# Patient Record
Sex: Female | Born: 1985 | Race: Black or African American | Hispanic: No | Marital: Single | Smoking: Never smoker
Health system: Southern US, Community
[De-identification: ages and names within clinical notes are randomized; demographics above are authoritative.]

## PROBLEM LIST (undated history)

## (undated) DIAGNOSIS — J45909 Unspecified asthma, uncomplicated: Secondary | ICD-10-CM

## (undated) DIAGNOSIS — O210 Mild hyperemesis gravidarum: Secondary | ICD-10-CM

## (undated) DIAGNOSIS — N390 Urinary tract infection, site not specified: Secondary | ICD-10-CM

## (undated) DIAGNOSIS — I959 Hypotension, unspecified: Secondary | ICD-10-CM

## (undated) DIAGNOSIS — M797 Fibromyalgia: Secondary | ICD-10-CM

## (undated) HISTORY — PX: ABDOMINAL SURGERY: SHX537

## (undated) HISTORY — PX: KNEE ARTHROSCOPY: SUR90

---

## 2014-03-24 ENCOUNTER — Encounter (HOSPITAL_BASED_OUTPATIENT_CLINIC_OR_DEPARTMENT_OTHER): Payer: Self-pay | Admitting: Emergency Medicine

## 2014-03-24 ENCOUNTER — Emergency Department (HOSPITAL_BASED_OUTPATIENT_CLINIC_OR_DEPARTMENT_OTHER)
Admission: EM | Admit: 2014-03-24 | Discharge: 2014-03-24 | Disposition: A | Discharge status: Home / Self Care | Attending: Emergency Medicine | Admitting: Emergency Medicine

## 2014-03-24 DIAGNOSIS — Z91040 Latex allergy status: Secondary | ICD-10-CM | POA: Insufficient documentation

## 2014-03-24 DIAGNOSIS — M791 Myalgia, unspecified site: Secondary | ICD-10-CM

## 2014-03-24 DIAGNOSIS — IMO0002 Reserved for concepts with insufficient information to code with codable children: Secondary | ICD-10-CM | POA: Insufficient documentation

## 2014-03-24 DIAGNOSIS — Z8679 Personal history of other diseases of the circulatory system: Secondary | ICD-10-CM | POA: Insufficient documentation

## 2014-03-24 DIAGNOSIS — J45909 Unspecified asthma, uncomplicated: Secondary | ICD-10-CM | POA: Insufficient documentation

## 2014-03-24 DIAGNOSIS — IMO0001 Reserved for inherently not codable concepts without codable children: Secondary | ICD-10-CM | POA: Insufficient documentation

## 2014-03-24 DIAGNOSIS — M25569 Pain in unspecified knee: Secondary | ICD-10-CM | POA: Insufficient documentation

## 2014-03-24 HISTORY — DX: Hypotension, unspecified: I95.9

## 2014-03-24 HISTORY — DX: Unspecified asthma, uncomplicated: J45.909

## 2014-03-24 HISTORY — DX: Fibromyalgia: M79.7

## 2014-03-24 LAB — COMPREHENSIVE METABOLIC PANEL
ALBUMIN: 4.5 g/dL (ref 3.5–5.2)
ALK PHOS: 52 U/L (ref 39–117)
ALT: 14 U/L (ref 0–35)
AST: 18 U/L (ref 0–37)
BILIRUBIN TOTAL: 0.3 mg/dL (ref 0.3–1.2)
BUN: 9 mg/dL (ref 6–23)
CHLORIDE: 103 meq/L (ref 96–112)
CO2: 23 mEq/L (ref 19–32)
Calcium: 10 mg/dL (ref 8.4–10.5)
Creatinine, Ser: 0.8 mg/dL (ref 0.50–1.10)
GFR calc Af Amer: >90 mL/min (ref >90)
GFR calc non Af Amer: >90 mL/min (ref >90)
Glucose, Bld: 82 mg/dL (ref 70–99)
POTASSIUM: 4.4 meq/L (ref 3.7–5.3)
SODIUM: 140 meq/L (ref 137–147)
TOTAL PROTEIN: 8.4 g/dL — AB (ref 6.0–8.3)

## 2014-03-24 LAB — CBC WITH DIFFERENTIAL/PLATELET
BASOS ABS: 0 10*3/uL (ref 0.0–0.1)
Basophils Relative: 0 % (ref 0–1)
Eosinophils Absolute: 0.1 10*3/uL (ref 0.0–0.7)
Eosinophils Relative: 2 % (ref 0–5)
HCT: 42.7 % (ref 36.0–46.0)
HEMOGLOBIN: 14.1 g/dL (ref 12.0–15.0)
Lymphocytes Relative: 48 % — ABNORMAL HIGH (ref 12–46)
Lymphs Abs: 1.6 10*3/uL (ref 0.7–4.0)
MCH: 26 pg (ref 26.0–34.0)
MCHC: 33 g/dL (ref 30.0–36.0)
MCV: 78.6 fL (ref 78.0–100.0)
MONOS PCT: 9 % (ref 3–12)
Monocytes Absolute: 0.3 10*3/uL (ref 0.1–1.0)
NEUTROS ABS: 1.4 10*3/uL — AB (ref 1.7–7.7)
Neutrophils Relative %: 41 % — ABNORMAL LOW (ref 43–77)
PLATELETS: 223 10*3/uL (ref 150–400)
RBC: 5.43 MIL/uL — ABNORMAL HIGH (ref 3.87–5.11)
RDW: 13.5 % (ref 11.5–15.5)
WBC: 3.4 10*3/uL — ABNORMAL LOW (ref 4.0–10.5)

## 2014-03-24 LAB — SEDIMENTATION RATE: Sed Rate: 2 mm/hr (ref 0–22)

## 2014-03-24 MED ORDER — HYDROCODONE-ACETAMINOPHEN 5-325 MG PO TABS: 2.0000 | ORAL_TABLET | ORAL | Status: DC | PRN

## 2014-03-24 MED ORDER — IBUPROFEN 800 MG PO TABS: 800.0000 mg | ORAL_TABLET | Freq: Three times a day (TID) | ORAL | Status: DC

## 2014-03-24 NOTE — ED Notes (Signed)
Patient states she woke up with left knee pain and now has progressed to bilateral leg pain and numbness of legs and feet.  No known injury.

## 2014-03-24 NOTE — Discharge Instructions (Signed)
°  Muscle Cramps and Spasms °Muscle cramps and spasms occur when a muscle or muscles tighten and you have no control over this tightening (involuntary muscle contraction). They are a common problem and can develop in any muscle. The most common place is in the calf muscles of the leg. Both muscle cramps and muscle spasms are involuntary muscle contractions, but they also have differences:  °· Muscle cramps are sporadic and painful. They may last a few seconds to a quarter of an hour. Muscle cramps are often more forceful and last longer than muscle spasms. °· Muscle spasms may or may not be painful. They may also last just a few seconds or much longer. °CAUSES  °It is uncommon for cramps or spasms to be due to a serious underlying problem. In many cases, the cause of cramps or spasms is unknown. Some common causes are:  °· Overexertion.   °· Overuse from repetitive motions (doing the same thing over and over).   °· Remaining in a certain position for a long period of time.   °· Improper preparation, form, or technique while performing a sport or activity.   °· Dehydration.   °· Injury.   °· Side effects of some medicines.   °· Abnormally low levels of the salts and ions in your blood (electrolytes), especially potassium and calcium. This could happen if you are taking water pills (diuretics) or you are pregnant.   °Some underlying medical problems can make it more likely to develop cramps or spasms. These include, but are not limited to:  °· Diabetes.   °· Parkinson disease.   °· Hormone disorders, such as thyroid problems.   °· Alcohol abuse.   °· Diseases specific to muscles, joints, and bones.   °· Blood vessel disease where not enough blood is getting to the muscles.   °HOME CARE INSTRUCTIONS  °· Stay well hydrated. Drink enough water and fluids to keep your urine clear or pale yellow. °· It may be helpful to massage, stretch, and relax the affected muscle. °· For tight or tense muscles, use a warm towel, heating  pad, or hot shower water directed to the affected area. °· If you are sore or have pain after a cramp or spasm, applying ice to the affected area may relieve discomfort. °· Put ice in a plastic bag. °· Place a towel between your skin and the bag. °· Leave the ice on for 15-20 minutes, 03-04 times a day. °· Medicines used to treat a known cause of cramps or spasms may help reduce their frequency or severity. Only take over-the-counter or prescription medicines as directed by your caregiver. °SEEK MEDICAL CARE IF:  °Your cramps or spasms get more severe, more frequent, or do not improve over time.  °MAKE SURE YOU:  °· Understand these instructions. °· Will watch your condition. °· Will get help right away if you are not doing well or get worse. °Document Released: 04/12/2002 Document Revised: 02/15/2013 Document Reviewed: 10/07/2012 °ExitCare® Patient Information ©2014 [employer]. ° ° °

## 2014-03-24 NOTE — ED Provider Notes (Signed)
CSN: [id number]     Arrival date & time 03/24/14  1228 History   First MD Initiated Contact with Patient 03/24/14 1309     Chief Complaint  Patient presents with  . Leg Pain     (Consider location/radiation/quality/duration/timing/severity/associated sxs/prior Treatment) Patient is a 28 y.o. female presenting with leg pain. The history is provided by the patient. No language interpreter was used.  Leg Pain Location:  Leg Injury: no   Leg location:  L leg and R leg Pain details:    Quality:  Aching   Severity:  Moderate   Onset quality:  Gradual   Duration:  1 month   Timing:  Constant Chronicity:  New Dislocation: no   Relieved by:  Nothing Worsened by:  Nothing tried Ineffective treatments:  None tried Associated symptoms: no back pain   Pt recently had an evaluation to rule out MS. Normal MRI.   Pt reports legs are achy and feel crampy.  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 Fibromyalgia   . Asthma   . Hypotension    Past Surgical History  Procedure Laterality Date  . Knee arthroscopy Left    No family history on file. History  Substance Use Topics  . Smoking status: Never Smoker   . Smokeless tobacco: Never Used  . Alcohol Use: No   OB History   Grav Para Term Preterm Abortions TAB SAB Ect Mult Living                 Review of Systems  Musculoskeletal: Negative for back pain.  Skin: Negative for wound.  All other systems reviewed and are negative.     Allergies  Latex  Home Medications   Prior to Admission medications   Medication Sig Start Date End Date Taking? Authorizing Provider  fluticasone (FLOVENT HFA) 110 MCG/ACT inhaler Inhale into the lungs 2 (two) times daily.   Yes Historical Provider, MD   BP 117/69  Pulse 74  Temp(Src) 98.4 F (36.9 C) (Oral)  Resp 20  Ht 5\' 4"  (1.626 m)  Wt 155 lb (70.308 kg)  BMI 26.59 kg/m2  SpO2 100%  LMP 03/21/2014 Physical Exam  Nursing note and  vitals reviewed. Constitutional: She is oriented to person, place, and time. She appears well-developed and well-nourished.  HENT:  Head: Normocephalic and atraumatic.  Eyes: EOM are normal. Pupils are equal, round, and reactive to light.  Neck: Normal range of motion.  Cardiovascular: Normal rate.   Pulmonary/Chest: Effort normal.  Abdominal: Soft. She exhibits no distension.  Musculoskeletal: Normal range of motion.  Tender left and right knee.  Tender legs diffusely  Neurological: She is alert and oriented to person, place, and time.  Skin: Skin is warm.  Psychiatric: She has a normal mood and affect.    ED Course  Procedures (including critical care time) Labs Review Labs Reviewed  CBC WITH DIFFERENTIAL - Abnormal; Notable for the following:    WBC 3.4 (*)    RBC 5.43 (*)    Neutrophils Relative % 41 (*)    Neutro Abs 1.4 (*)    Lymphocytes Relative 48 (*)    All other components within normal limits  COMPREHENSIVE METABOLIC PANEL - Abnormal; Notable for the following:    Total Protein 8.4 (*)    All other components within normal limits  SEDIMENTATION RATE    Imaging Review No results found.   EKG Interpretation None  MDM   Final diagnoses:  None    Ibuprofen Hydrocodone Follow up with your MD for recheck    Elson AreasLeslie K Shary Lamos, PA-C 03/24/14 1618

## 2014-03-25 NOTE — ED Provider Notes (Signed)
Medical screening examination/treatment/procedure(s) were performed by non-physician practitioner and as supervising physician I was immediately available for consultation/collaboration.   EKG Interpretation None        Gwyneth Sprout, MD 03/25/14 [phone]

## 2014-08-12 ENCOUNTER — Emergency Department (HOSPITAL_BASED_OUTPATIENT_CLINIC_OR_DEPARTMENT_OTHER)
Admission: EM | Admit: 2014-08-12 | Discharge: 2014-08-12 | Disposition: A | Discharge status: Home / Self Care | Attending: Emergency Medicine | Admitting: Emergency Medicine

## 2014-08-12 ENCOUNTER — Encounter (HOSPITAL_BASED_OUTPATIENT_CLINIC_OR_DEPARTMENT_OTHER): Payer: Self-pay | Admitting: Emergency Medicine

## 2014-08-12 DIAGNOSIS — O9A211 Injury, poisoning and certain other consequences of external causes complicating pregnancy, first trimester: Secondary | ICD-10-CM | POA: Insufficient documentation

## 2014-08-12 DIAGNOSIS — S233XXA Sprain of ligaments of thoracic spine, initial encounter: Secondary | ICD-10-CM

## 2014-08-12 DIAGNOSIS — Z7952 Long term (current) use of systemic steroids: Secondary | ICD-10-CM | POA: Insufficient documentation

## 2014-08-12 DIAGNOSIS — S29012A Strain of muscle and tendon of back wall of thorax, initial encounter: Secondary | ICD-10-CM | POA: Insufficient documentation

## 2014-08-12 DIAGNOSIS — Z3A09 9 weeks gestation of pregnancy: Secondary | ICD-10-CM | POA: Insufficient documentation

## 2014-08-12 DIAGNOSIS — O99511 Diseases of the respiratory system complicating pregnancy, first trimester: Secondary | ICD-10-CM | POA: Insufficient documentation

## 2014-08-12 DIAGNOSIS — Y92410 Unspecified street and highway as the place of occurrence of the external cause: Secondary | ICD-10-CM | POA: Insufficient documentation

## 2014-08-12 DIAGNOSIS — Z791 Long term (current) use of non-steroidal anti-inflammatories (NSAID): Secondary | ICD-10-CM | POA: Insufficient documentation

## 2014-08-12 DIAGNOSIS — Y9389 Activity, other specified: Secondary | ICD-10-CM | POA: Insufficient documentation

## 2014-08-12 DIAGNOSIS — J45909 Unspecified asthma, uncomplicated: Secondary | ICD-10-CM | POA: Insufficient documentation

## 2014-08-12 DIAGNOSIS — Z8739 Personal history of other diseases of the musculoskeletal system and connective tissue: Secondary | ICD-10-CM | POA: Insufficient documentation

## 2014-08-12 DIAGNOSIS — Z91040 Latex allergy status: Secondary | ICD-10-CM | POA: Insufficient documentation
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
## 2014-08-12 NOTE — ED Provider Notes (Signed)
History/physical exam/procedure(s) were performed by non-physician practitioner and as supervising physician I was immediately available for consultation/collaboration. I have reviewed all notes and am in agreement with care and plan.   Hilario Quarryanielle S Davied Nocito, MD 08/12/14 [phone]
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
## 2014-08-18 ENCOUNTER — Encounter (HOSPITAL_BASED_OUTPATIENT_CLINIC_OR_DEPARTMENT_OTHER): Payer: Self-pay | Admitting: Emergency Medicine

## 2014-08-18 ENCOUNTER — Emergency Department (HOSPITAL_BASED_OUTPATIENT_CLINIC_OR_DEPARTMENT_OTHER)
Admission: EM | Admit: 2014-08-18 | Discharge: 2014-08-18 | Disposition: A | Discharge status: Home / Self Care | Attending: Emergency Medicine | Admitting: Emergency Medicine

## 2014-08-18 DIAGNOSIS — B349 Viral infection, unspecified: Secondary | ICD-10-CM | POA: Diagnosis not present

## 2014-08-18 DIAGNOSIS — Z791 Long term (current) use of non-steroidal anti-inflammatories (NSAID): Secondary | ICD-10-CM | POA: Insufficient documentation

## 2014-08-18 DIAGNOSIS — Z7951 Long term (current) use of inhaled steroids: Secondary | ICD-10-CM | POA: Diagnosis not present

## 2014-08-18 DIAGNOSIS — R059 Cough, unspecified: Secondary | ICD-10-CM

## 2014-08-18 DIAGNOSIS — R05 Cough: Secondary | ICD-10-CM | POA: Diagnosis present

## 2014-08-18 DIAGNOSIS — J45909 Unspecified asthma, uncomplicated: Secondary | ICD-10-CM | POA: Diagnosis not present

## 2014-08-18 NOTE — ED Provider Notes (Signed)
CSN: [id number]     Arrival date & time 08/18/14  1347 History   First MD Initiated Contact with Patient 08/18/14 1352     Chief Complaint  Patient presents with  . Cough     (Consider location/radiation/quality/duration/timing/severity/associated sxs/prior Treatment) HPI Comments: Pt comes in today with c/o of cough times one week. She states that she is [redacted] weeks pregnant. Pt states that she has needed her inhaler more frequently. She denies fevers or sob. Denies any abdominal pain  The history is provided by the patient. No language interpreter was used.    Past Medical History  Diagnosis Date  . Fibromyalgia   . Asthma   . Hypotension    Past Surgical History  Procedure Laterality Date  . Knee arthroscopy Left    History reviewed. No pertinent family history. History  Substance Use Topics  . Smoking status: Never Smoker   . Smokeless tobacco: Never Used  . Alcohol Use: No   OB History   Grav Para Term Preterm Abortions TAB SAB Ect Mult Living   1              Review of Systems  Constitutional: Negative.   Respiratory: Positive for cough.   Cardiovascular: Negative.       Allergies  Latex  Home Medications   Prior to Admission medications   Medication Sig Start Date End Date Taking? Authorizing Provider  fluticasone (FLOVENT HFA) 110 MCG/ACT inhaler Inhale into the lungs 2 (two) times daily.    Historical Provider, MD  HYDROcodone-acetaminophen (NORCO/VICODIN) 5-325 MG per tablet Take 2 tablets by mouth every 4 (four) hours as needed. 03/24/14   Elson AreasLeslie K Sofia, PA-C  ibuprofen (ADVIL,MOTRIN) 800 MG tablet Take 1 tablet (800 mg total) by mouth 3 (three) times daily. 03/24/14   Elson AreasLeslie K Sofia, PA-C  Prenatal Multivit-Min-Fe-FA (PRENATAL VITAMINS PO) Take by mouth.    Historical Provider, MD   BP 148/87  Pulse 102  Temp(Src) 99.7 F (37.6 C) (Oral)  Resp 20  SpO2 99%  LMP 06/12/2014 Physical Exam  Nursing note and vitals reviewed. Constitutional: She  appears well-developed and well-nourished.  HENT:  Right Ear: External ear normal.  Left Ear: External ear normal.  Nose: Rhinorrhea present.  Mouth/Throat: Posterior oropharyngeal erythema present.  Eyes: Conjunctivae and EOM are normal.  Cardiovascular: Normal rate and regular rhythm.   Pulmonary/Chest: Effort normal and breath sounds normal.    ED Course  Procedures (including critical care time) Labs Review Labs Reviewed - No data to display  Imaging Review No results found.   EKG Interpretation None      MDM   Final diagnoses:  Cough  Viral illness    No focal abnormality noted on respiratory exam. Pt afebrile. Don't think imaging is needed at his time. Discussed symptomatic treatment at home and return precautions    Teressa LowerVrinda Chianti Goh, NP 08/18/14 1408

## 2014-08-18 NOTE — Discharge Instructions (Signed)
Take benadryl as discussed. Follow up with your doctor for continued or worsening symptoms Cough, Adult  A cough is a reflex that helps clear your throat and airways. It can help heal the body or may be a reaction to an irritated airway. A cough may only last 2 or 3 weeks (acute) or may last more than 8 weeks (chronic).  CAUSES Acute cough:  Viral or bacterial infections. Chronic cough:  Infections.  Allergies.  Asthma.  Post-nasal drip.  Smoking.  Heartburn or acid reflux.  Some medicines.  Chronic lung problems (COPD).  Cancer. SYMPTOMS   Cough.  Fever.  Chest pain.  Increased breathing rate.  High-pitched whistling sound when breathing (wheezing).  Colored mucus that you cough up (sputum). TREATMENT   A bacterial cough may be treated with antibiotic medicine.  A viral cough must run its course and will not respond to antibiotics.  Your caregiver may recommend other treatments if you have a chronic cough. HOME CARE INSTRUCTIONS   Only take over-the-counter or prescription medicines for pain, discomfort, or fever as directed by your caregiver. Use cough suppressants only as directed by your caregiver.  Use a cold steam vaporizer or humidifier in your bedroom or home to help loosen secretions.  Sleep in a semi-upright position if your cough is worse at night.  Rest as needed.  Stop smoking if you smoke. SEEK IMMEDIATE MEDICAL CARE IF:   You have pus in your sputum.  Your cough starts to worsen.  You cannot control your cough with suppressants and are losing sleep.  You begin coughing up blood.  You have difficulty breathing.  You develop pain which is getting worse or is uncontrolled with medicine.  You have a fever. MAKE SURE YOU:   Understand these instructions.  Will watch your condition.  Will get help right away if you are not doing well or get worse. Document Released: 04/19/2011 Document Revised: 01/13/2012 Document Reviewed:  04/19/2011 [facility] Patient Information 2015 [place], [place]. This information is not intended to replace advice given to you by your health care provider. Make sure you discuss any questions you have with your health care provider.

## 2014-08-18 NOTE — ED Notes (Signed)
Pt amb to room 5 with quick steady gait in nad. Pt reports cough x 1 wwek, initially clear sputum, now greenish per pt.

## 2014-08-18 NOTE — ED Provider Notes (Signed)
Medical screening examination/treatment/procedure(s) were performed by non-physician practitioner and as supervising physician I was immediately available for consultation/collaboration.   EKG Interpretation None        Elwin MochaBlair Chastin Garlitz, MD 08/18/14 1513

## 2014-09-05 ENCOUNTER — Encounter (HOSPITAL_BASED_OUTPATIENT_CLINIC_OR_DEPARTMENT_OTHER): Payer: Self-pay | Admitting: Emergency Medicine

## 2016-03-27 ENCOUNTER — Encounter (HOSPITAL_BASED_OUTPATIENT_CLINIC_OR_DEPARTMENT_OTHER): Payer: Self-pay | Admitting: Emergency Medicine

## 2016-03-27 ENCOUNTER — Emergency Department (HOSPITAL_BASED_OUTPATIENT_CLINIC_OR_DEPARTMENT_OTHER)
Admission: EM | Admit: 2016-03-27 | Discharge: 2016-03-27 | Disposition: A | Discharge status: Home / Self Care | Payer: Medicaid Other | Attending: Emergency Medicine | Admitting: Emergency Medicine

## 2016-03-27 DIAGNOSIS — J45909 Unspecified asthma, uncomplicated: Secondary | ICD-10-CM | POA: Insufficient documentation

## 2016-03-27 DIAGNOSIS — R102 Pelvic and perineal pain: Secondary | ICD-10-CM | POA: Diagnosis present

## 2016-03-27 DIAGNOSIS — M5431 Sciatica, right side: Secondary | ICD-10-CM | POA: Insufficient documentation

## 2016-03-27 MED ORDER — PREDNISONE 10 MG PO TABS: 20.0000 mg | ORAL_TABLET | Freq: Two times a day (BID) | ORAL | Status: DC

## 2016-03-27 MED ORDER — TRAMADOL HCL 50 MG PO TABS: 50.0000 mg | ORAL_TABLET | Freq: Four times a day (QID) | ORAL | Status: DC | PRN
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
## 2016-03-27 NOTE — Discharge Instructions (Signed)
Prednisone as prescribed.  Tramadol as prescribed as needed for pain.  Keep the appointment with your orthopedist as scheduled, and return to the ER if symptoms significantly worsen or change.   Sciatica Sciatica is pain, weakness, numbness, or tingling along the path of the sciatic nerve. The nerve starts in the lower back and runs down the back of each leg. The nerve controls the muscles in the lower leg and in the back of the knee, while also providing sensation to the back of the thigh, lower leg, and the sole of your foot. Sciatica is a symptom of another medical condition. For instance, nerve damage or certain conditions, such as a herniated disk or bone spur on the spine, pinch or put pressure on the sciatic nerve. This causes the pain, weakness, or other sensations normally associated with sciatica. Generally, sciatica only affects one side of the body. CAUSES   Herniated or slipped disc.  Degenerative disk disease.  A pain disorder involving the narrow muscle in the buttocks (piriformis syndrome).  Pelvic injury or fracture.  Pregnancy.  Tumor (rare). SYMPTOMS  Symptoms can vary from mild to very severe. The symptoms usually travel from the low back to the buttocks and down the back of the leg. Symptoms can include:  Mild tingling or dull aches in the lower back, leg, or hip.  Numbness in the back of the calf or sole of the foot.  Burning sensations in the lower back, leg, or hip.  Sharp pains in the lower back, leg, or hip.  Leg weakness.  Severe back pain inhibiting movement. These symptoms may get worse with coughing, sneezing, laughing, or prolonged sitting or standing. Also, being overweight may worsen symptoms. DIAGNOSIS  Your caregiver will perform a physical exam to look for common symptoms of sciatica. He or she may ask you to do certain movements or activities that would trigger sciatic nerve pain. Other tests may be performed to find the cause of the  sciatica. These may include:  Blood tests.  X-rays.  Imaging tests, such as an MRI or CT scan. TREATMENT  Treatment is directed at the cause of the sciatic pain. Sometimes, treatment is not necessary and the pain and discomfort goes away on its own. If treatment is needed, your caregiver may suggest:  Over-the-counter medicines to relieve pain.  Prescription medicines, such as anti-inflammatory medicine, muscle relaxants, or narcotics.  Applying heat or ice to the painful area.  Steroid injections to lessen pain, irritation, and inflammation around the nerve.  Reducing activity during periods of pain.  Exercising and stretching to strengthen your abdomen and improve flexibility of your spine. Your caregiver may suggest losing weight if the extra weight makes the back pain worse.  Physical therapy.  Surgery to eliminate what is pressing or pinching the nerve, such as a bone spur or part of a herniated disk. HOME CARE INSTRUCTIONS   Only take over-the-counter or prescription medicines for pain or discomfort as directed by your caregiver.  Apply ice to the affected area for 20 minutes, 3-4 times a day for the first 48-72 hours. Then try heat in the same way.  Exercise, stretch, or perform your usual activities if these do not aggravate your pain.  Attend physical therapy sessions as directed by your caregiver.  Keep all follow-up appointments as directed by your caregiver.  Do not wear high heels or shoes that do not provide proper support.  Check your mattress to see if it is too soft. A firm mattress  may lessen your pain and discomfort. SEEK IMMEDIATE MEDICAL CARE IF:   You lose control of your bowel or bladder (incontinence).  You have increasing weakness in the lower back, pelvis, buttocks, or legs.  You have redness or swelling of your back.  You have a burning sensation when you urinate.  You have pain that gets worse when you lie down or awakens you at  night.  Your pain is worse than you have experienced in the past.  Your pain is lasting longer than 4 weeks.  You are suddenly losing weight without reason. MAKE SURE YOU:  Understand these instructions.  Will watch your condition.  Will get help right away if you are not doing well or get worse.   This information is not intended to replace advice given to you by your health care provider. Make sure you discuss any questions you have with your health care provider.   Document Released: 10/15/2001 Document Revised: 07/12/2015 Document Reviewed: 03/01/2012 Elsevier Interactive Patient Education [employer].

## 2016-03-27 NOTE — ED Provider Notes (Signed)
CSN: [id number]     Arrival date & time 03/27/16  0901 History   First MD Initiated Contact with Patient 03/27/16 0914     Chief Complaint  Patient presents with  . Hip Pain     (Consider location/radiation/quality/duration/timing/severity/associated sxs/prior Treatment) HPI Comments: Patient is a 30 year old female who presents with complaints of pain in her right buttock radiating down the back of her leg. This started yesterday after a fall. She reports a history of her hip "giving out on her" since she was in the [employer] several years ago. She reports that she injured running and it has given her intermittent problems since then. She was recently seen by her primary doctor and given a cortisone injection, started on meloxicam, and is currently awaiting an appointment with an orthopedist. She denies any bowel or bladder complaints. She denies to me she is experiencing any pain in her back.  Patient is a 30 y.o. female presenting with hip pain. The history is provided by the patient.  Hip Pain This is a new problem. The current episode started yesterday. The problem occurs constantly. The problem has been gradually worsening. The symptoms are aggravated by walking (Bearing weight). Nothing relieves the symptoms. Treatments tried: NSAIDs. The treatment provided no relief.    Past Medical History  Diagnosis Date  . Fibromyalgia   . Asthma   . Hypotension    Past Surgical History  Procedure Laterality Date  . Knee arthroscopy Left    History reviewed. No pertinent family history. Social History  Substance Use Topics  . Smoking status: Never Smoker   . Smokeless tobacco: Never Used  . Alcohol Use: No   OB History    Gravida Para Term Preterm AB TAB SAB Ectopic Multiple Living   1              Review of Systems  All other systems reviewed and are negative.     Allergies  Latex  Home Medications   Prior to Admission medications   Medication Sig Start Date End Date  Taking? Authorizing Provider  fluticasone (FLOVENT HFA) 110 MCG/ACT inhaler Inhale into the lungs 2 (two) times daily.    Historical Provider, MD  HYDROcodone-acetaminophen (NORCO/VICODIN) 5-325 MG per tablet Take 2 tablets by mouth every 4 (four) hours as needed. 03/24/14   Elson AreasLeslie K Sofia, PA-C  ibuprofen (ADVIL,MOTRIN) 800 MG tablet Take 1 tablet (800 mg total) by mouth 3 (three) times daily. 03/24/14   Elson AreasLeslie K Sofia, PA-C  Prenatal Multivit-Min-Fe-FA (PRENATAL VITAMINS PO) Take by mouth.    Historical Provider, MD   BP 117/84 mmHg  Pulse 70  Temp(Src) 97.8 F (36.6 C) (Oral)  Resp 18  Ht 5\' 4"  (1.626 m)  Wt 155 lb (70.308 kg)  BMI 26.59 kg/m2  SpO2 100%  Breastfeeding? Unknown Physical Exam  Constitutional: She is oriented to person, place, and time. She appears well-developed and well-nourished. No distress.  HENT:  Head: Normocephalic and atraumatic.  Neck: Normal range of motion. Neck supple.  Musculoskeletal:  There is tenderness to palpation in the soft tissues of the right buttock. There is no lumbar spine tenderness.  Neurological: She is alert and oriented to person, place, and time.  DTRs are trace and symmetrical in the patellar and Achilles tendon bilaterally. Strength is 5 out of 5 in both lower extremities. She is able to ambulate with some difficulty.  Skin: Skin is warm and dry. She is not diaphoretic.  Nursing note and vitals reviewed.  ED Course  Procedures (including critical care time) Labs Review Labs Reviewed - No data to display  Imaging Review No results found. I have personally reviewed and evaluated these images and lab results as part of my medical decision-making.   EKG Interpretation None      MDM   Final diagnoses:  None    This appears to be sciatica. She will be treated with prednisone and tramadol. She is to keep her appointment with an orthopedist as scheduled.  I see no indication for imaging. I highly doubt a hip fracture she is  able to ambulate and bear weight without difficulty.    Geoffery Lyons, MD 03/27/16 [id number]

## 2016-04-05 ENCOUNTER — Encounter (HOSPITAL_BASED_OUTPATIENT_CLINIC_OR_DEPARTMENT_OTHER): Payer: Self-pay | Admitting: *Deleted

## 2016-04-05 ENCOUNTER — Emergency Department (HOSPITAL_BASED_OUTPATIENT_CLINIC_OR_DEPARTMENT_OTHER): Payer: Medicaid Other

## 2016-04-05 ENCOUNTER — Emergency Department (HOSPITAL_BASED_OUTPATIENT_CLINIC_OR_DEPARTMENT_OTHER)
Admission: EM | Admit: 2016-04-05 | Discharge: 2016-04-05 | Disposition: A | Discharge status: Home / Self Care | Payer: Medicaid Other | Attending: Emergency Medicine | Admitting: Emergency Medicine

## 2016-04-05 DIAGNOSIS — Y939 Activity, unspecified: Secondary | ICD-10-CM | POA: Diagnosis not present

## 2016-04-05 DIAGNOSIS — J45909 Unspecified asthma, uncomplicated: Secondary | ICD-10-CM | POA: Diagnosis not present

## 2016-04-05 DIAGNOSIS — R51 Headache: Secondary | ICD-10-CM | POA: Diagnosis present

## 2016-04-05 DIAGNOSIS — Y999 Unspecified external cause status: Secondary | ICD-10-CM | POA: Diagnosis not present

## 2016-04-05 DIAGNOSIS — Y92410 Unspecified street and highway as the place of occurrence of the external cause: Secondary | ICD-10-CM | POA: Diagnosis not present

## 2016-04-05 DIAGNOSIS — S060X0A Concussion without loss of consciousness, initial encounter: Secondary | ICD-10-CM | POA: Diagnosis not present

## 2016-04-05 DIAGNOSIS — M542 Cervicalgia: Secondary | ICD-10-CM | POA: Diagnosis not present

## 2016-04-05 MED ORDER — DIAZEPAM 2 MG PO TABS
2.0000 mg | ORAL_TABLET | Freq: Once | ORAL | Status: AC
  Administered 2016-04-05: 2 mg via ORAL
  Filled 2016-04-05: qty 1

## 2016-04-05 MED ORDER — ONDANSETRON 4 MG PO TBDP
4.0000 mg | ORAL_TABLET | Freq: Once | ORAL | Status: AC
  Administered 2016-04-05: 4 mg via ORAL
  Filled 2016-04-05: qty 1

## 2016-04-05 MED ORDER — NAPROXEN 500 MG PO TABS: 500.0000 mg | ORAL_TABLET | Freq: Two times a day (BID) | ORAL | Status: DC

## 2016-04-05 MED ORDER — METHOCARBAMOL 500 MG PO TABS: 500.0000 mg | ORAL_TABLET | Freq: Two times a day (BID) | ORAL | Status: DC

## 2016-04-05 MED ORDER — KETOROLAC TROMETHAMINE 60 MG/2ML IM SOLN
60.0000 mg | Freq: Once | INTRAMUSCULAR | Status: AC
  Administered 2016-04-05: 60 mg via INTRAMUSCULAR
  Filled 2016-04-05: qty 2

## 2016-04-05 NOTE — Discharge Instructions (Signed)
Concussion, Adult A concussion, or closed-head injury, is a brain injury caused by a direct blow to the head or by a quick and sudden movement (jolt) of the head or neck. Concussions are usually not life-threatening. Even so, the effects of a concussion can be serious. If you have had a concussion before, you are more likely to experience concussion-like symptoms after a direct blow to the head.  CAUSES  Direct blow to the head, such as from running into another player during a soccer game, being hit in a fight, or hitting your head on a hard surface.  A jolt of the head or neck that causes the brain to move back and forth inside the skull, such as in a car crash. SIGNS AND SYMPTOMS The signs of a concussion can be hard to notice. Early on, they may be missed by you, family members, and health care providers. You may look fine but act or feel differently. Symptoms are usually temporary, but they may last for days, weeks, or even longer. Some symptoms may appear right away while others may not show up for hours or days. Every head injury is different. Symptoms include:  Mild to moderate headaches that will not go away.  A feeling of pressure inside your head.  Having more trouble than usual:  Learning or remembering things you have heard.  Answering questions.  Paying attention or concentrating.  Organizing daily tasks.  Making decisions and solving problems.  Slowness in thinking, acting or reacting, speaking, or reading.  Getting lost or being easily confused.  Feeling tired all the time or lacking energy (fatigued).  Feeling drowsy.  Sleep disturbances.  Sleeping more than usual.  Sleeping less than usual.  Trouble falling asleep.  Trouble sleeping (insomnia).  Loss of balance or feeling lightheaded or dizzy.  Nausea or vomiting.  Numbness or tingling.  Increased sensitivity to:  Sounds.  Lights.  Distractions.  Vision problems or eyes that tire  easily.  Diminished sense of taste or smell.  Ringing in the ears.  Mood changes such as feeling sad or anxious.  Becoming easily irritated or angry for little or no reason.  Lack of motivation.  Seeing or hearing things other people do not see or hear (hallucinations). DIAGNOSIS Your health care provider can usually diagnose a concussion based on a description of your injury and symptoms. He or she will ask whether you passed out (lost consciousness) and whether you are having trouble remembering events that happened right before and during your injury. Your evaluation might include:  A brain scan to look for signs of injury to the brain. Even if the test shows no injury, you may still have a concussion.  Blood tests to be sure other problems are not present. TREATMENT  Concussions are usually treated in an emergency department, in urgent care, or at a clinic. You may need to stay in the hospital overnight for further treatment.  Tell your health care provider if you are taking any medicines, including prescription medicines, over-the-counter medicines, and natural remedies. Some medicines, such as blood thinners (anticoagulants) and aspirin, may increase the chance of complications. Also tell your health care provider whether you have had alcohol or are taking illegal drugs. This information may affect treatment.  Your health care provider will send you home with important instructions to follow.  How fast you will recover from a concussion depends on many factors. These factors include how severe your concussion is, what part of your brain was injured,  your age, and how healthy you were before the concussion.  Most people with mild injuries recover fully. Recovery can take time. In general, recovery is slower in older persons. Also, persons who have had a concussion in the past or have other medical problems may find that it takes longer to recover from their current injury. HOME  CARE INSTRUCTIONS General Instructions  Carefully follow the directions your health care provider gave you.  Only take over-the-counter or prescription medicines for pain, discomfort, or fever as directed by your health care provider.  Take only those medicines that your health care provider has approved.  Do not drink alcohol until your health care provider says you are well enough to do so. Alcohol and certain other drugs may slow your recovery and can put you at risk of further injury.  If it is harder than usual to remember things, write them down.  If you are easily distracted, try to do one thing at a time. For example, do not try to watch TV while fixing dinner.  Talk with family members or close friends when making important decisions.  Keep all follow-up appointments. Repeated evaluation of your symptoms is recommended for your recovery.  Watch your symptoms and tell others to do the same. Complications sometimes occur after a concussion. Older adults with a brain injury may have a higher risk of serious complications, such as a blood clot on the brain.  Tell your teachers, school nurse, school counselor, coach, athletic trainer, or work Freight forwarder about your injury, symptoms, and restrictions. Tell them about what you can or cannot do. They should watch for:  Increased problems with attention or concentration.  Increased difficulty remembering or learning new information.  Increased time needed to complete tasks or assignments.  Increased irritability or decreased ability to cope with stress.  Increased symptoms.  Rest. Rest helps the brain to heal. Make sure you:  Get plenty of sleep at night. Avoid staying up late at night.  Keep the same bedtime hours on weekends and weekdays.  Rest during the day. Take daytime naps or rest breaks when you feel tired.  Limit activities that require a lot of thought or concentration. These include:  Doing homework or job-related  work.  Watching TV.  Working on the computer.  Avoid any situation where there is potential for another head injury (football, hockey, soccer, basketball, martial arts, downhill snow sports and horseback riding). Your condition will get worse every time you experience a concussion. You should avoid these activities until you are evaluated by the appropriate follow-up health care providers. Returning To Your Regular Activities You will need to return to your normal activities slowly, not all at once. You must give your body and brain enough time for recovery.  Do not return to sports or other athletic activities until your health care provider tells you it is safe to do so.  Ask your health care provider when you can drive, ride a bicycle, or operate heavy machinery. Your ability to react may be slower after a brain injury. Never do these activities if you are dizzy.  Ask your health care provider about when you can return to work or school. Preventing Another Concussion It is very important to avoid another brain injury, especially before you have recovered. In rare cases, another injury can lead to permanent brain damage, brain swelling, or death. The risk of this is greatest during the first 7-10 days after a head injury. Avoid injuries by:  Wearing a  seat belt when riding in a car.  Drinking alcohol only in moderation.  Wearing a helmet when biking, skiing, skateboarding, skating, or doing similar activities.  Avoiding activities that could lead to a second concussion, such as contact or recreational sports, until your health care provider says it is okay.  Taking safety measures in your home.  Remove clutter and tripping hazards from floors and stairways.  Use grab bars in bathrooms and handrails by stairs.  Place non-slip mats on floors and in bathtubs.  Improve lighting in dim areas. SEEK MEDICAL CARE IF:  You have increased problems paying attention or  concentrating.  You have increased difficulty remembering or learning new information.  You need more time to complete tasks or assignments than before.  You have increased irritability or decreased ability to cope with stress.  You have more symptoms than before. Seek medical care if you have any of the following symptoms for more than 2 weeks after your injury:  Lasting (chronic) headaches.  Dizziness or balance problems.  Nausea.  Vision problems.  Increased sensitivity to noise or light.  Depression or mood swings.  Anxiety or irritability.  Memory problems.  Difficulty concentrating or paying attention.  Sleep problems.  Feeling tired all the time. SEEK IMMEDIATE MEDICAL CARE IF:  You have severe or worsening headaches. These may be a sign of a blood clot in the brain.  You have weakness (even if only in one hand, leg, or part of the face).  You have numbness.  You have decreased coordination.  You vomit repeatedly.  You have increased sleepiness.  One pupil is larger than the other.  You have convulsions.  You have slurred speech.  You have increased confusion. This may be a sign of a blood clot in the brain.  You have increased restlessness, agitation, or irritability.  You are unable to recognize people or places.  You have neck pain.  It is difficult to wake you up.  You have unusual behavior changes.  You lose consciousness. MAKE SURE YOU:  Understand these instructions.  Will watch your condition.  Will get help right away if you are not doing well or get worse.   This information is not intended to replace advice given to you by your health care provider. Make sure you discuss any questions you have with your health care provider.   Document Released: 01/11/2004 Document Revised: 11/11/2014 Document Reviewed: 05/13/2013 [employer] Interactive Patient Education 2016 [employer]  Cervical Strain and Sprain With Rehab Cervical  strain and sprain are injuries that commonly occur with "whiplash" injuries. Whiplash occurs when the neck is forcefully whipped backward or forward, such as during a motor vehicle accident or during contact sports. The muscles, ligaments, tendons, discs, and nerves of the neck are susceptible to injury when this occurs. RISK FACTORS Risk of having a whiplash injury increases if:  Osteoarthritis of the spine.  Situations that make head or neck accidents or trauma more likely.  High-risk sports (football, rugby, wrestling, hockey, auto racing, gymnastics, diving, contact karate, or boxing).  Poor strength and flexibility of the neck.  Previous neck injury.  Poor tackling technique.  Improperly fitted or padded equipment. SYMPTOMS   Pain or stiffness in the front or back of neck or both.  Symptoms may present immediately or up to 24 hours after injury.  Dizziness, headache, nausea, and vomiting.  Muscle spasm with soreness and stiffness in the neck.  Tenderness and swelling at the injury site. PREVENTION  Learn  and use proper technique (avoid tackling with the head, spearing, and head-butting; use proper falling techniques to avoid landing on the head).  Warm up and stretch properly before activity.  Maintain physical fitness:  Strength, flexibility, and endurance.  Cardiovascular fitness.  Wear properly fitted and padded protective equipment, such as padded soft collars, for participation in contact sports. PROGNOSIS  Recovery from cervical strain and sprain injuries is dependent on the extent of the injury. These injuries are usually curable in 1 week to 3 months with appropriate treatment.  RELATED COMPLICATIONS   Temporary numbness and weakness may occur if the nerve roots are damaged, and this may persist until the nerve has completely healed.  Chronic pain due to frequent recurrence of symptoms.  Prolonged healing, especially if activity is resumed too soon  (before complete recovery). TREATMENT  Treatment initially involves the use of ice and medication to help reduce pain and inflammation. It is also important to perform strengthening and stretching exercises and modify activities that worsen symptoms so the injury does not get worse. These exercises may be performed at home or with a therapist. For patients who experience severe symptoms, a soft, padded collar may be recommended to be worn around the neck.  Improving your posture may help reduce symptoms. Posture improvement includes pulling your chin and abdomen in while sitting or standing. If you are sitting, sit in a firm chair with your buttocks against the back of the chair. While sleeping, try replacing your pillow with a small towel rolled to 2 inches in diameter, or use a cervical pillow or soft cervical collar. Poor sleeping positions delay healing.  For patients with nerve root damage, which causes numbness or weakness, the use of a cervical traction apparatus may be recommended. Surgery is rarely necessary for these injuries. However, cervical strain and sprains that are present at birth (congenital) may require surgery. MEDICATION   If pain medication is necessary, nonsteroidal anti-inflammatory medications, such as aspirin and ibuprofen, or other minor pain relievers, such as acetaminophen, are often recommended.  Do not take pain medication for 7 days before surgery.  Prescription pain relievers may be given if deemed necessary by your caregiver. Use only as directed and only as much as you need. HEAT AND COLD:   Cold treatment (icing) relieves pain and reduces inflammation. Cold treatment should be applied for 10 to 15 minutes every 2 to 3 hours for inflammation and pain and immediately after any activity that aggravates your symptoms. Use ice packs or an ice massage.  Heat treatment may be used prior to performing the stretching and strengthening activities prescribed by your  caregiver, physical therapist, or athletic trainer. Use a heat pack or a warm soak. SEEK MEDICAL CARE IF:   Symptoms get worse or do not improve in 2 weeks despite treatment.  New, unexplained symptoms develop (drugs used in treatment may produce side effects). EXERCISES RANGE OF MOTION (ROM) AND STRETCHING EXERCISES - Cervical Strain and Sprain These exercises may help you when beginning to rehabilitate your injury. In order to successfully resolve your symptoms, you must improve your posture. These exercises are designed to help reduce the forward-head and rounded-shoulder posture which contributes to this condition. Your symptoms may resolve with or without further involvement from your physician, physical therapist or athletic trainer. While completing these exercises, remember:   Restoring tissue flexibility helps normal motion to return to the joints. This allows healthier, less painful movement and activity.  An effective stretch should be held for  at least 20 seconds, although you may need to begin with shorter hold times for comfort.  A stretch should never be painful. You should only feel a gentle lengthening or release in the stretched tissue. STRETCH- Axial Extensors  Lie on your back on the floor. You may bend your knees for comfort. Place a rolled-up hand towel or dish towel, about 2 inches in diameter, under the part of your head that makes contact with the floor.  Gently tuck your chin, as if trying to make a "double chin," until you feel a gentle stretch at the base of your head.  Hold __________ seconds. Repeat __________ times. Complete this exercise __________ times per day.  STRETCH - Axial Extension   Stand or sit on a firm surface. Assume a good posture: chest up, shoulders drawn back, abdominal muscles slightly tense, knees unlocked (if standing) and feet hip width apart.  Slowly retract your chin so your head slides back and your chin slightly lowers. Continue to  look straight ahead.  You should feel a gentle stretch in the back of your head. Be certain not to feel an aggressive stretch since this can cause headaches later.  Hold for __________ seconds. Repeat __________ times. Complete this exercise __________ times per day. STRETCH - Cervical Side Bend   Stand or sit on a firm surface. Assume a good posture: chest up, shoulders drawn back, abdominal muscles slightly tense, knees unlocked (if standing) and feet hip width apart.  Without letting your nose or shoulders move, slowly tip your right / left ear to your shoulder until your feel a gentle stretch in the muscles on the opposite side of your neck.  Hold __________ seconds. Repeat __________ times. Complete this exercise __________ times per day. STRETCH - Cervical Rotators   Stand or sit on a firm surface. Assume a good posture: chest up, shoulders drawn back, abdominal muscles slightly tense, knees unlocked (if standing) and feet hip width apart.  Keeping your eyes level with the ground, slowly turn your head until you feel a gentle stretch along the back and opposite side of your neck.  Hold __________ seconds. Repeat __________ times. Complete this exercise __________ times per day. RANGE OF MOTION - Neck Circles   Stand or sit on a firm surface. Assume a good posture: chest up, shoulders drawn back, abdominal muscles slightly tense, knees unlocked (if standing) and feet hip width apart.  Gently roll your head down and around from the back of one shoulder to the back of the other. The motion should never be forced or painful.  Repeat the motion 10-20 times, or until you feel the neck muscles relax and loosen. Repeat __________ times. Complete the exercise __________ times per day. STRENGTHENING EXERCISES - Cervical Strain and Sprain These exercises may help you when beginning to rehabilitate your injury. They may resolve your symptoms with or without further involvement from your  physician, physical therapist, or athletic trainer. While completing these exercises, remember:   Muscles can gain both the endurance and the strength needed for everyday activities through controlled exercises.  Complete these exercises as instructed by your physician, physical therapist, or athletic trainer. Progress the resistance and repetitions only as guided.  You may experience muscle soreness or fatigue, but the pain or discomfort you are trying to eliminate should never worsen during these exercises. If this pain does worsen, stop and make certain you are following the directions exactly. If the pain is still present after adjustments, discontinue the exercise  until you can discuss the trouble with your clinician. STRENGTH - Cervical Flexors, Isometric  Face a wall, standing about 6 inches away. Place a small pillow, a ball about 6-8 inches in diameter, or a folded towel between your forehead and the wall.  Slightly tuck your chin and gently push your forehead into the soft object. Push only with mild to moderate intensity, building up tension gradually. Keep your jaw and forehead relaxed.  Hold 10 to 20 seconds. Keep your breathing relaxed.  Release the tension slowly. Relax your neck muscles completely before you start the next repetition. Repeat __________ times. Complete this exercise __________ times per day. STRENGTH- Cervical Lateral Flexors, Isometric   Stand about 6 inches away from a wall. Place a small pillow, a ball about 6-8 inches in diameter, or a folded towel between the side of your head and the wall.  Slightly tuck your chin and gently tilt your head into the soft object. Push only with mild to moderate intensity, building up tension gradually. Keep your jaw and forehead relaxed.  Hold 10 to 20 seconds. Keep your breathing relaxed.  Release the tension slowly. Relax your neck muscles completely before you start the next repetition. Repeat __________ times.  Complete this exercise __________ times per day. STRENGTH - Cervical Extensors, Isometric   Stand about 6 inches away from a wall. Place a small pillow, a ball about 6-8 inches in diameter, or a folded towel between the back of your head and the wall.  Slightly tuck your chin and gently tilt your head back into the soft object. Push only with mild to moderate intensity, building up tension gradually. Keep your jaw and forehead relaxed.  Hold 10 to 20 seconds. Keep your breathing relaxed.  Release the tension slowly. Relax your neck muscles completely before you start the next repetition. Repeat __________ times. Complete this exercise __________ times per day. POSTURE AND BODY MECHANICS CONSIDERATIONS - Cervical Strain and Sprain Keeping correct posture when sitting, standing or completing your activities will reduce the stress put on different body tissues, allowing injured tissues a chance to heal and limiting painful experiences. The following are general guidelines for improved posture. Your physician or physical therapist will provide you with any instructions specific to your needs. While reading these guidelines, remember:  The exercises prescribed by your provider will help you have the flexibility and strength to maintain correct postures.  The correct posture provides the optimal environment for your joints to work. All of your joints have less wear and tear when properly supported by a spine with good posture. This means you will experience a healthier, less painful body.  Correct posture must be practiced with all of your activities, especially prolonged sitting and standing. Correct posture is as important when doing repetitive low-stress activities (typing) as it is when doing a single heavy-load activity (lifting). PROLONGED STANDING WHILE SLIGHTLY LEANING FORWARD When completing a task that requires you to lean forward while standing in one place for a long time, place either foot  up on a stationary 2- to 4-inch high object to help maintain the best posture. When both feet are on the ground, the low back tends to lose its slight inward curve. If this curve flattens (or becomes too large), then the back and your other joints will experience too much stress, fatigue more quickly, and can cause pain.  RESTING POSITIONS Consider which positions are most painful for you when choosing a resting position. If you have pain with flexion-based  activities (sitting, bending, stooping, squatting), choose a position that allows you to rest in a less flexed posture. You would want to avoid curling into a fetal position on your side. If your pain worsens with extension-based activities (prolonged standing, working overhead), avoid resting in an extended position such as sleeping on your stomach. Most people will find more comfort when they rest with their spine in a more neutral position, neither too rounded nor too arched. Lying on a non-sagging bed on your side with a pillow between your knees, or on your back with a pillow under your knees will often provide some relief. Keep in mind, being in any one position for a prolonged period of time, no matter how correct your posture, can still lead to stiffness. WALKING Walk with an upright posture. Your ears, shoulders, and hips should all line up. OFFICE WORK When working at a desk, create an environment that supports good, upright posture. Without extra support, muscles fatigue and lead to excessive strain on joints and other tissues. CHAIR:  A chair should be able to slide under your desk when your back makes contact with the back of the chair. This allows you to work closely.  The chair's height should allow your eyes to be level with the upper part of your monitor and your hands to be slightly lower than your elbows.  Body position:  Your feet should make contact with the floor. If this is not possible, use a foot rest.  Keep your ears  over your shoulders. This will reduce stress on your neck and low back.   This information is not intended to replace advice given to you by your health care provider. Make sure you discuss any questions you have with your health care provider.   Document Released: 10/21/2005 Document Revised: 11/11/2014 Document Reviewed: 02/02/2009 [employer] Interactive Patient Education [employer]
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
## 2016-06-18 ENCOUNTER — Encounter (HOSPITAL_BASED_OUTPATIENT_CLINIC_OR_DEPARTMENT_OTHER): Payer: Self-pay | Admitting: Emergency Medicine

## 2016-06-18 ENCOUNTER — Emergency Department (HOSPITAL_BASED_OUTPATIENT_CLINIC_OR_DEPARTMENT_OTHER)
Admission: EM | Admit: 2016-06-18 | Discharge: 2016-06-18 | Disposition: A | Discharge status: Home / Self Care | Payer: Medicaid Other | Attending: Emergency Medicine | Admitting: Emergency Medicine

## 2016-06-18 ENCOUNTER — Emergency Department (HOSPITAL_BASED_OUTPATIENT_CLINIC_OR_DEPARTMENT_OTHER): Payer: Medicaid Other

## 2016-06-18 DIAGNOSIS — S92912A Unspecified fracture of left toe(s), initial encounter for closed fracture: Secondary | ICD-10-CM | POA: Diagnosis not present

## 2016-06-18 DIAGNOSIS — Y939 Activity, unspecified: Secondary | ICD-10-CM | POA: Insufficient documentation

## 2016-06-18 DIAGNOSIS — J45909 Unspecified asthma, uncomplicated: Secondary | ICD-10-CM | POA: Insufficient documentation

## 2016-06-18 DIAGNOSIS — Z7951 Long term (current) use of inhaled steroids: Secondary | ICD-10-CM | POA: Insufficient documentation

## 2016-06-18 DIAGNOSIS — S99922A Unspecified injury of left foot, initial encounter: Secondary | ICD-10-CM | POA: Diagnosis present

## 2016-06-18 DIAGNOSIS — Y929 Unspecified place or not applicable: Secondary | ICD-10-CM | POA: Insufficient documentation

## 2016-06-18 DIAGNOSIS — W208XXA Other cause of strike by thrown, projected or falling object, initial encounter: Secondary | ICD-10-CM | POA: Insufficient documentation

## 2016-06-18 DIAGNOSIS — I10 Essential (primary) hypertension: Secondary | ICD-10-CM | POA: Diagnosis not present

## 2016-06-18 DIAGNOSIS — Y999 Unspecified external cause status: Secondary | ICD-10-CM | POA: Insufficient documentation

## 2016-06-18 MED ORDER — ACETAMINOPHEN 500 MG PO TABS
1000.0000 mg | ORAL_TABLET | Freq: Once | ORAL | Status: AC
  Administered 2016-06-18: 1000 mg via ORAL
  Filled 2016-06-18: qty 2

## 2016-06-18 MED ORDER — IBUPROFEN 800 MG PO TABS
800.0000 mg | ORAL_TABLET | Freq: Once | ORAL | Status: AC
  Administered 2016-06-18: 800 mg via ORAL
  Filled 2016-06-18: qty 1

## 2016-06-18 NOTE — ED Notes (Signed)
Patient transported to X-ray 

## 2016-06-18 NOTE — ED Notes (Signed)
MD at bedside. 

## 2016-06-18 NOTE — Discharge Instructions (Signed)
Take 4 over the counter ibuprofen tablets 3 times a day or 2 over-the-counter naproxen tablets twice a day for pain. Also take tylenol 1000mg(2 extra strength) four times a day.    
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
## 2016-11-17 ENCOUNTER — Emergency Department (HOSPITAL_BASED_OUTPATIENT_CLINIC_OR_DEPARTMENT_OTHER)
Admission: EM | Admit: 2016-11-17 | Discharge: 2016-11-17 | Disposition: A | Discharge status: Home / Self Care | Payer: Medicaid Other | Attending: Emergency Medicine | Admitting: Emergency Medicine

## 2016-11-17 ENCOUNTER — Encounter (HOSPITAL_BASED_OUTPATIENT_CLINIC_OR_DEPARTMENT_OTHER): Payer: Self-pay | Admitting: Emergency Medicine

## 2016-11-17 DIAGNOSIS — N3001 Acute cystitis with hematuria: Secondary | ICD-10-CM

## 2016-11-17 DIAGNOSIS — J45909 Unspecified asthma, uncomplicated: Secondary | ICD-10-CM | POA: Diagnosis not present

## 2016-11-17 DIAGNOSIS — Z79899 Other long term (current) drug therapy: Secondary | ICD-10-CM | POA: Diagnosis not present

## 2016-11-17 DIAGNOSIS — I10 Essential (primary) hypertension: Secondary | ICD-10-CM | POA: Diagnosis not present

## 2016-11-17 DIAGNOSIS — R319 Hematuria, unspecified: Secondary | ICD-10-CM | POA: Diagnosis present

## 2016-11-17 LAB — URINALYSIS, ROUTINE W REFLEX MICROSCOPIC
BILIRUBIN URINE: NEGATIVE
Glucose, UA: NEGATIVE mg/dL
Ketones, ur: NEGATIVE mg/dL
Nitrite: NEGATIVE
PH: 7 (ref 5.0–8.0)
Protein, ur: NEGATIVE mg/dL
Specific Gravity, Urine: 1.021 (ref 1.005–1.030)

## 2016-11-17 LAB — URINALYSIS, MICROSCOPIC (REFLEX)

## 2016-11-17 LAB — PREGNANCY, URINE: Preg Test, Ur: NEGATIVE

## 2016-11-17 MED ORDER — CEPHALEXIN 500 MG PO CAPS: 500.0000 mg | ORAL_CAPSULE | Freq: Two times a day (BID) | ORAL | 0 refills | Status: DC

## 2016-11-17 NOTE — ED Notes (Signed)
ED Provider at bedside. 

## 2016-11-17 NOTE — ED Provider Notes (Signed)
[facility]   CSN: [id number] Arrival date & time: 11/17/16  1443  By signing my name below, I, Javier Dockerobert Ryan Halas, attest that this documentation has been prepared under the direction and in the presence of Cheri FowlerKayla Homar Weinkauf, PA-C. Electronically Signed: Javier Dockerobert Ryan Halas, ER Scribe. 06/15/2016. 4:26 PM.  History   Chief Complaint Chief Complaint  Patient presents with  . Hematuria   The history is provided by the patient. No language interpreter was used.   HPI Comments: Nicole Barron is a 31 y.o. female who presents to the Emergency Department complaining of dysuria, urinary frequency, and hematuria that started at 4:30am this morning with associated mild abdominal cramping and low back pain. She states her sx are currently completely resolved. The abdominal pain felt similar to menstrual pain. Her normal period is due in two days.  She denies vaginal bleeding, rectal bleeding, or vaginal discharge.  She is sexually active, with one regular sexual partner. She was tested in December for STIs.  She defers pelvic and STD testing today. She has NKDA.  Past Medical History:  Diagnosis Date  . Asthma   . Fibromyalgia   . Hypotension     There are no active problems to display for this patient.   Past Surgical History:  Procedure Laterality Date  . KNEE ARTHROSCOPY Left     OB History    Gravida Para Term Preterm AB Living   1             SAB TAB Ectopic Multiple Live Births                   Home Medications    Prior to Admission medications   Medication Sig Start Date End Date Taking? Authorizing Provider  albuterol (PROVENTIL HFA;VENTOLIN HFA) 108 (90 Base) MCG/ACT inhaler Inhale into the lungs every 6 (six) hours as needed for wheezing or shortness of breath.   Yes Historical Provider, MD  fluticasone (FLOVENT HFA) 110 MCG/ACT inhaler Inhale into the lungs 2 (two) times daily.   Yes Historical Provider, MD  cephALEXin (KEFLEX) 500 MG capsule Take 1  capsule (500 mg total) by mouth 2 (two) times daily. 11/17/16   Cheri FowlerKayla Sohil Timko, PA-C  HYDROcodone-acetaminophen (NORCO/VICODIN) 5-325 MG per tablet Take 2 tablets by mouth every 4 (four) hours as needed. 03/24/14   Elson AreasLeslie K Sofia, PA-C  ibuprofen (ADVIL,MOTRIN) 800 MG tablet Take 1 tablet (800 mg total) by mouth 3 (three) times daily. 03/24/14   Elson AreasLeslie K Sofia, PA-C  methocarbamol (ROBAXIN) 500 MG tablet Take 1 tablet (500 mg total) by mouth 2 (two) times daily. 04/05/16   Stevi Barrett, PA-C  naproxen (NAPROSYN) 500 MG tablet Take 1 tablet (500 mg total) by mouth 2 (two) times daily. 04/05/16   Stevi Barrett, PA-C  predniSONE (DELTASONE) 10 MG tablet Take 2 tablets (20 mg total) by mouth 2 (two) times daily. 03/27/16   Geoffery Lyonsouglas Delo, MD  Prenatal Multivit-Min-Fe-FA (PRENATAL VITAMINS PO) Take by mouth.    Historical Provider, MD  traMADol (ULTRAM) 50 MG tablet Take 1 tablet (50 mg total) by mouth every 6 (six) hours as needed. 03/27/16   Geoffery Lyonsouglas Delo, MD    Family History No family history on file.  Social History Social History  Substance Use Topics  . Smoking status: Never Smoker  . Smokeless tobacco: Never Used  . Alcohol use No     Allergies   Latex   Review of Systems Review of Systems  Constitutional:  Negative for chills and fever.  Genitourinary: Positive for difficulty urinating, frequency, hematuria and urgency. Negative for dysuria.    Physical Exam Updated Vital Signs BP 131/66 (BP Location: Right Arm)   Pulse 91   Temp 98.8 F (37.1 C) (Oral)   Resp 16   Ht 5\' 4"  (1.626 m)   Wt 70.3 kg   LMP 10/22/2016   SpO2 100%   BMI 26.61 kg/m   Physical Exam  Constitutional: She is oriented to person, place, and time. She appears well-developed and well-nourished.  Non-toxic appearance. She does not have a sickly appearance. She does not appear ill. No distress.  HENT:  Head: Normocephalic and atraumatic.  Mouth/Throat: Oropharynx is clear and moist.  Eyes: Conjunctivae are  normal. Pupils are equal, round, and reactive to light.  Neck: Normal range of motion. Neck supple.  Cardiovascular: Normal rate and regular rhythm.   Pulmonary/Chest: Effort normal and breath sounds normal. No accessory muscle usage or stridor. No respiratory distress. She has no wheezes. She has no rhonchi. She has no rales.  Abdominal: Soft. Bowel sounds are normal. She exhibits no distension. There is no tenderness. There is no rebound and no guarding.  No CVA tenderness bilaterally.   Musculoskeletal: Normal range of motion.  Lymphadenopathy:    She has no cervical adenopathy.  Neurological: She is alert and oriented to person, place, and time. Coordination normal.  Speech clear without dysarthria.  Skin: Skin is warm and dry. She is not diaphoretic.  Psychiatric: She has a normal mood and affect. Her behavior is normal.  Nursing note and vitals reviewed.    ED Treatments / Results  DIAGNOSTIC STUDIES: Oxygen Saturation is 100% on RA, normal by my interpretation.    COORDINATION OF CARE: 4:25 PM Discussed treatment plan with pt at bedside and pt agreed to plan.  Labs (all labs ordered are listed, but only abnormal results are displayed) Labs Reviewed  URINALYSIS, ROUTINE W REFLEX MICROSCOPIC - Abnormal; Notable for the following:       Result Value   APPearance CLOUDY (*)    Hgb urine dipstick MODERATE (*)    Leukocytes, UA MODERATE (*)    All other components within normal limits  URINALYSIS, MICROSCOPIC (REFLEX) - Abnormal; Notable for the following:    Bacteria, UA MANY (*)    Squamous Epithelial / LPF 0-5 (*)    All other components within normal limits  PREGNANCY, URINE    EKG  EKG Interpretation None       Radiology No results found.  Procedures Procedures (including critical care time)  Medications Ordered in ED Medications - No data to display   Initial Impression / Assessment and Plan / ED Course  I have reviewed the triage vital signs and the  nursing notes.  Pertinent labs & imaging results that were available during my care of the patient were reviewed by me and considered in my medical decision making (see chart for details).  Clinical Course    Patient presents with findings c/w UTI.  No systemic symptoms.  Abdominal pain resolved and similar to menstrual cramps.  I do not think this is ovarian torsion.  Low suspicion for PID, TOA, or cervicitis given recent STD testing in December.  Again, she declined repeat testing or pelvic today.  She is not pregnant. No CVA tenderness to suggest pyelonephritis.  No fever.  Low suspicion for urolithiasis.  Urine appears infectious.  This has been sent for culture.  She will be treated with  keflex.  Discussed OTC azo for 3 days.  Return precautions discussed.  Stable for discharge.   Final Clinical Impressions(s) / ED Diagnoses   Final diagnoses:  Acute cystitis with hematuria    New Prescriptions Discharge Medication List as of 11/17/2016  4:29 PM    START taking these medications   Details  cephALEXin (KEFLEX) 500 MG capsule Take 1 capsule (500 mg total) by mouth 2 (two) times daily., Starting Sun 11/17/2016, Print         I personally performed the services described in this documentation, which was scribed in my presence. The recorded information has been reviewed and is accurate.         Cheri Fowler, PA-C 11/17/16 1652    Doug Sou, MD 11/17/16 [phone]

## 2016-11-17 NOTE — Discharge Instructions (Signed)
Start taking Keflex to treat for UTI.  You may take over the counter Azo pills for 3 days to help with spasms and burning.  [facility] to establish a primary care doctor.  Return to the ED for any new or concerning symptoms.

## 2016-11-17 NOTE — ED Triage Notes (Signed)
Pt reports R lower back and abd pain with hematuria, onset this morning. Pt reports increase in urinary frequency and urgency.

## 2016-11-17 NOTE — ED Notes (Signed)
Pt given d/c instructions as per chart. Rx x 1. Verbalizes understanding. No questions. 

## 2016-11-17 NOTE — ED Notes (Addendum)
Pt describes cramping in her back that awakened her from sleep this a.m. Noticed pink color on tissue, but small clots in toilet. Later describes dysuria and urine was darker. Went to obtain specimen here and it was "fine". Menses due in 2 days. "Always on time." No discomfort or complaints at present.

## 2016-12-29 ENCOUNTER — Emergency Department (HOSPITAL_BASED_OUTPATIENT_CLINIC_OR_DEPARTMENT_OTHER): Payer: Medicaid Other

## 2016-12-29 ENCOUNTER — Emergency Department (HOSPITAL_BASED_OUTPATIENT_CLINIC_OR_DEPARTMENT_OTHER)
Admission: EM | Admit: 2016-12-29 | Discharge: 2016-12-30 | Disposition: A | Discharge status: Home / Self Care | Payer: Medicaid Other | Attending: Emergency Medicine | Admitting: Emergency Medicine

## 2016-12-29 ENCOUNTER — Encounter (HOSPITAL_BASED_OUTPATIENT_CLINIC_OR_DEPARTMENT_OTHER): Payer: Self-pay | Admitting: Emergency Medicine

## 2016-12-29 DIAGNOSIS — M25571 Pain in right ankle and joints of right foot: Secondary | ICD-10-CM | POA: Diagnosis present

## 2016-12-29 DIAGNOSIS — M79672 Pain in left foot: Secondary | ICD-10-CM

## 2016-12-29 DIAGNOSIS — J45909 Unspecified asthma, uncomplicated: Secondary | ICD-10-CM | POA: Diagnosis not present

## 2016-12-29 NOTE — ED Notes (Signed)
Pain to right foot since Friday, swelling noted to outer aspect of right ankle, CMS intact. States has 'shooting pain' with wt bearing

## 2016-12-29 NOTE — ED Triage Notes (Signed)
Pt reports right ankle and foot pain since Thursday after "running around with daughter" on Wednesday.  Pt denies acute injury, denies swelling.  Pt ambulatory.

## 2016-12-29 NOTE — ED Provider Notes (Signed)
[facility]   CSN: [id number] Arrival date & time: 12/29/16  2147   By signing my name below, I, Nicole Barron, attest that this documentation has been prepared under the direction and in the presence of Wilburn Mylar, PA-C Electronically Signed: Soijett Barron, ED Scribe. 12/29/16. 11:50 PM.  History   Chief Complaint Chief Complaint  Patient presents with  . Ankle Pain  . Foot Pain    HPI Nicole Barron is a 31 y.o. female who presents to the Emergency Department complaining of right ankle pain onset 3 days ago. Pt reports associated right foot pain and gait problem due to pain. Pt has tried 800 mg ibuprofen with no relief of her symptoms. Pt notes that she was playing with her daughter 4 days ago prior to the onset of her symptoms. She notes that ambulating on the ball of her right foot alleviates her pain. She denies color change, wound, and any other symptoms.    The history is provided by the patient. No language interpreter was used.    Past Medical History:  Diagnosis Date  . Asthma   . Fibromyalgia   . Hypotension     There are no active problems to display for this patient.   Past Surgical History:  Procedure Laterality Date  . KNEE ARTHROSCOPY Left     OB History    Gravida Para Term Preterm AB Living   1             SAB TAB Ectopic Multiple Live Births                   Home Medications    Prior to Admission medications   Medication Sig Start Date End Date Taking? Authorizing Provider  albuterol (PROVENTIL HFA;VENTOLIN HFA) 108 (90 Base) MCG/ACT inhaler Inhale into the lungs every 6 (six) hours as needed for wheezing or shortness of breath.    Historical Provider, MD  cephALEXin (KEFLEX) 500 MG capsule Take 1 capsule (500 mg total) by mouth 2 (two) times daily. 11/17/16   Kayla Rose, PA-C  fluticasone (FLOVENT HFA) 110 MCG/ACT inhaler Inhale into the lungs 2 (two) times daily.    Historical Provider, MD    HYDROcodone-acetaminophen (NORCO/VICODIN) 5-325 MG per tablet Take 2 tablets by mouth every 4 (four) hours as needed. 03/24/14   Elson Areas, PA-C  ibuprofen (ADVIL,MOTRIN) 800 MG tablet Take 1 tablet (800 mg total) by mouth 3 (three) times daily. 03/24/14   Elson Areas, PA-C  methocarbamol (ROBAXIN) 500 MG tablet Take 1 tablet (500 mg total) by mouth 2 (two) times daily. 04/05/16   Stevi Barrett, PA-C  naproxen (NAPROSYN) 500 MG tablet Take 1 tablet (500 mg total) by mouth 2 (two) times daily. 04/05/16   Stevi Barrett, PA-C  predniSONE (DELTASONE) 10 MG tablet Take 2 tablets (20 mg total) by mouth 2 (two) times daily. 03/27/16   Geoffery Lyons, MD  Prenatal Multivit-Min-Fe-FA (PRENATAL VITAMINS PO) Take by mouth.    Historical Provider, MD  traMADol (ULTRAM) 50 MG tablet Take 1 tablet (50 mg total) by mouth every 6 (six) hours as needed. 03/27/16   Geoffery Lyons, MD    Family History History reviewed. No pertinent family history.  Social History Social History  Substance Use Topics  . Smoking status: Never Smoker  . Smokeless tobacco: Never Used  . Alcohol use No     Allergies   Latex   Review of Systems Review of Systems  Musculoskeletal: Positive for arthralgias (right foot and right ankle) and gait problem (due to pain).  Skin: Negative for color change and wound.  Neurological: Negative for weakness and numbness.  All other systems reviewed and are negative.    Physical Exam Updated Vital Signs BP 114/68 (BP Location: Left Arm)   Pulse 85   Temp 97.9 F (36.6 C) (Oral)   Resp 16   Ht 5\' 4"  (1.626 m)   Wt 155 lb (70.3 kg)   LMP 12/18/2016 (Exact Date)   SpO2 100%   Breastfeeding? No   BMI 26.61 kg/m   Physical Exam  Constitutional: She is oriented to person, place, and time. She appears well-developed and well-nourished. No distress.  Non-toxic appearing.  HENT:  Head: Normocephalic and atraumatic.  Eyes: EOM are normal.  Neck: Neck supple.  Cardiovascular:  Normal rate and intact distal pulses.   Pulmonary/Chest: Effort normal. No respiratory distress.  Abdominal: She exhibits no distension.  Musculoskeletal: Normal range of motion.  No edema of the right foot or ankle. No ecchymosis or erythema or warmth. Full range of motion. No tenderness to palpation. DP pulses are 2+ bilaterally. Sensation intact to sharp/dull. No obvious deformities or crepitus. Cap refill normal. Strength 5 out of 5 in lower extremity is.  Neurological: She is alert and oriented to person, place, and time.  Skin: Skin is warm and dry. Capillary refill takes less than 2 seconds.  Psychiatric: She has a normal mood and affect. Her behavior is normal.  Nursing note and vitals reviewed.    ED Treatments / Results  DIAGNOSTIC STUDIES: Oxygen Saturation is 100% on RA, nl by my interpretation.    COORDINATION OF CARE: 11:42 PM Discussed treatment plan with pt at bedside which includes right foot xray, right ankle xray, referral and follow up with orthopedist, brace, and pt agreed to plan.  Radiology Dg Ankle Complete Right  Result Date: 12/29/2016 CLINICAL DATA:  Right foot and ankle pain since this morning. No injury. EXAM: RIGHT ANKLE - COMPLETE 3+ VIEW COMPARISON:  None. FINDINGS: There is no evidence of fracture, dislocation, or joint effusion. There is no evidence of arthropathy or other focal bone abnormality. Soft tissues are unremarkable. IMPRESSION: Negative. Electronically Signed   By: Elberta Fortis M.D.   On: 12/29/2016 23:36   Dg Foot Complete Right  Result Date: 12/29/2016 CLINICAL DATA:  Medial foot and ankle pain since this morning. No injury. EXAM: RIGHT FOOT COMPLETE - 3+ VIEW COMPARISON:  None. FINDINGS: There is no evidence of fracture or dislocation. There is no evidence of arthropathy or other focal bone abnormality. Soft tissues are unremarkable. IMPRESSION: Negative. Electronically Signed   By: Elberta Fortis M.D.   On: 12/29/2016 23:35     Procedures Procedures (including critical care time)  Medications Ordered in ED Medications - No data to display   Initial Impression / Assessment and Plan / ED Course  I have reviewed the triage vital signs and the nursing notes.  Pertinent imaging results that were available during my care of the patient were reviewed by me and considered in my medical decision making (see chart for details).     Patient X-Ray negative for obvious fracture or dislocation. Pain could be due to plantar fasciitis. Encouraged stretches and heel lifts. Pt advised to follow up with orthopedics. Patient states that she has a physical therapist that she currently sees and will discuss stretches with her. She is neurovascularly intact. Able to ambulate. Full range of motion. Patient  given brace while in ED, conservative therapy recommended and discussed. Patient will be discharged home & is agreeable with above plan. Returns precautions discussed. Pt appears safe for discharge.  Final Clinical Impressions(s) / ED Diagnoses   Final diagnoses:  Acute right ankle pain  Foot pain, left    New Prescriptions Discharge Medication List as of 12/30/2016 12:00 AM     I personally performed the services described in this documentation, which was scribed in my presence. The recorded information has been reviewed and is accurate.     Rise MuKenneth T Kendle Erker, PA-C 12/30/16 0032    Layla MawKristen N Ward, DO 12/30/16 [id number]

## 2016-12-30 NOTE — Discharge Instructions (Signed)
Motrin and Tylenol for pain. Rest, ice, elevate the ankle. Wear the ankle splint for comfort. Follow-up with your primary care doctor for physical therapy. May wear heel inserts.

## 2017-03-09 ENCOUNTER — Encounter (HOSPITAL_BASED_OUTPATIENT_CLINIC_OR_DEPARTMENT_OTHER): Payer: Self-pay | Admitting: Emergency Medicine

## 2017-03-09 ENCOUNTER — Emergency Department (HOSPITAL_BASED_OUTPATIENT_CLINIC_OR_DEPARTMENT_OTHER)
Admission: EM | Admit: 2017-03-09 | Discharge: 2017-03-10 | Disposition: A | Discharge status: Home / Self Care | Payer: Medicaid Other | Attending: Emergency Medicine | Admitting: Emergency Medicine

## 2017-03-09 DIAGNOSIS — J45909 Unspecified asthma, uncomplicated: Secondary | ICD-10-CM | POA: Diagnosis not present

## 2017-03-09 DIAGNOSIS — J029 Acute pharyngitis, unspecified: Secondary | ICD-10-CM

## 2017-03-09 DIAGNOSIS — W57XXXA Bitten or stung by nonvenomous insect and other nonvenomous arthropods, initial encounter: Secondary | ICD-10-CM | POA: Insufficient documentation

## 2017-03-09 DIAGNOSIS — Y929 Unspecified place or not applicable: Secondary | ICD-10-CM | POA: Insufficient documentation

## 2017-03-09 DIAGNOSIS — Y999 Unspecified external cause status: Secondary | ICD-10-CM | POA: Insufficient documentation

## 2017-03-09 DIAGNOSIS — B309 Viral conjunctivitis, unspecified: Secondary | ICD-10-CM

## 2017-03-09 DIAGNOSIS — S00461A Insect bite (nonvenomous) of right ear, initial encounter: Secondary | ICD-10-CM | POA: Diagnosis not present

## 2017-03-09 DIAGNOSIS — Y939 Activity, unspecified: Secondary | ICD-10-CM | POA: Insufficient documentation

## 2017-03-09 DIAGNOSIS — H1089 Other conjunctivitis: Secondary | ICD-10-CM | POA: Diagnosis not present

## 2017-03-09 DIAGNOSIS — R51 Headache: Secondary | ICD-10-CM | POA: Diagnosis present

## 2017-03-09 DIAGNOSIS — Z79899 Other long term (current) drug therapy: Secondary | ICD-10-CM | POA: Diagnosis not present

## 2017-03-09 LAB — RAPID STREP SCREEN (MED CTR MEBANE ONLY): Streptococcus, Group A Screen (Direct): NEGATIVE

## 2017-03-09 MED ORDER — POLYMYXIN B-TRIMETHOPRIM 10000-0.1 UNIT/ML-% OP SOLN: 1.0000 [drp] | OPHTHALMIC | 0 refills | Status: DC

## 2017-03-09 MED ORDER — IBUPROFEN 600 MG PO TABS: 600.0000 mg | ORAL_TABLET | Freq: Four times a day (QID) | ORAL | 0 refills | Status: DC | PRN

## 2017-03-09 NOTE — ED Provider Notes (Signed)
[facility]   CSN: [id number] Arrival date & time: 03/09/17  1957  By signing my name below, I, Rosario Adie, attest that this documentation has been prepared under the direction and in the presence of Shontay Wallner, Mayer Masker, MD. Electronically Signed: Rosario Adie, ED Scribe. 03/09/17. 11:10 PM.  History   Chief Complaint Chief Complaint  Patient presents with  . Headache   The history is provided by the patient. No language interpreter was used.    HPI Comments: Nicole Barron is a 31 y.o. female w/ a h/o fibromyalgia and asthma, who presents to the Emergency Department complaining of persistent sore throat beginning five days ago. She notes associated right ear pain, generalized myalgias, cough, right eye redness, and mild headache. Pt additionally notes that she recently found a tick on her right ear and this is what brought her into the ED tonight; she states that she otherwise would not have come into the ED if it were not for this. Her headache is worse with coughing. She has been taking Ibuprofen at home with temporary improvement of her symptoms. Pt has a h/o migraines and notes that her headache is somewhat similar to this. Her daughter was recently sick with conjunctivitis and otitis media. She denies fever, or any other associated symptoms.   Past Medical History:  Diagnosis Date  . Asthma   . Fibromyalgia   . Hypotension    There are no active problems to display for this patient.  Past Surgical History:  Procedure Laterality Date  . KNEE ARTHROSCOPY Left    OB History    Gravida Para Term Preterm AB Living   1             SAB TAB Ectopic Multiple Live Births                 Home Medications    Prior to Admission medications   Medication Sig Start Date End Date Taking? Authorizing Provider  albuterol (PROVENTIL HFA;VENTOLIN HFA) 108 (90 Base) MCG/ACT inhaler Inhale into the lungs every 6 (six) hours as needed for wheezing  or shortness of breath.    [provider]  cephALEXin (KEFLEX) 500 MG capsule Take 1 capsule (500 mg total) by mouth 2 (two) times daily. 11/17/16   Cheri Fowler, PA-C  fluticasone (FLOVENT HFA) 110 MCG/ACT inhaler Inhale into the lungs 2 (two) times daily.    [provider]  HYDROcodone-acetaminophen (NORCO/VICODIN) 5-325 MG per tablet Take 2 tablets by mouth every 4 (four) hours as needed. 03/24/14   Elson Areas, PA-C  ibuprofen (ADVIL,MOTRIN) 600 MG tablet Take 1 tablet (600 mg total) by mouth every 6 (six) hours as needed. 03/09/17   Henslee Lottman, Mayer Masker, MD  ibuprofen (ADVIL,MOTRIN) 800 MG tablet Take 1 tablet (800 mg total) by mouth 3 (three) times daily. 03/24/14   Elson Areas, PA-C  methocarbamol (ROBAXIN) 500 MG tablet Take 1 tablet (500 mg total) by mouth 2 (two) times daily. 04/05/16   Barrett, Rolm Gala, PA-C  naproxen (NAPROSYN) 500 MG tablet Take 1 tablet (500 mg total) by mouth 2 (two) times daily. 04/05/16   Barrett, Rolm Gala, PA-C  predniSONE (DELTASONE) 10 MG tablet Take 2 tablets (20 mg total) by mouth 2 (two) times daily. 03/27/16   Geoffery Lyons, MD  Prenatal Multivit-Min-Fe-FA (PRENATAL VITAMINS PO) Take by mouth.    [provider]  traMADol (ULTRAM) 50 MG tablet Take 1 tablet (50 mg total) by mouth every 6 (  six) hours as needed. 03/27/16   Geoffery Lyons, MD  trimethoprim-polymyxin b (POLYTRIM) ophthalmic solution Place 1 drop into the right eye every 4 (four) hours. 03/09/17   Yisroel Mullendore, Mayer Masker, MD   Family History No family history on file.  Social History Social History  Substance Use Topics  . Smoking status: Never Smoker  . Smokeless tobacco: Never Used  . Alcohol use No   Allergies   Latex  Review of Systems Review of Systems  Constitutional: Negative for fever.  HENT: Positive for ear pain (right).   Eyes: Positive for redness (right).  Respiratory: Positive for cough.   Cardiovascular: Negative for chest pain.  Gastrointestinal: Negative  for abdominal pain, nausea and vomiting.  Musculoskeletal: Positive for myalgias.  Neurological: Positive for headaches.  All other systems reviewed and are negative.  Physical Exam Updated Vital Signs BP 113/64 (BP Location: Right Arm)   Pulse 79   Temp 99.8 F (37.7 C) (Oral)   Resp 18   Ht 5\' 4"  (1.626 m)   Wt 160 lb (72.6 kg)   LMP 02/11/2017   SpO2 100%   BMI 27.46 kg/m   Physical Exam  Constitutional: She is oriented to person, place, and time. She appears well-developed and well-nourished. No distress.  HENT:  Head: Normocephalic and atraumatic.  Left Ear: External ear normal.  Mouth/Throat: No oropharyngeal exudate.  Mild effusion noted behind the right TM, no significant bulging or erythema, no tonsillar exudate, uvula midline, no tonsillar swelling  Eyes: Pupils are equal, round, and reactive to light.  Right conjunctival injection with tearing  Neck: Neck supple.  Cardiovascular: Normal rate, regular rhythm and normal heart sounds.   No murmur heard. Pulmonary/Chest: Effort normal and breath sounds normal. No respiratory distress. She has no wheezes.  Abdominal: Soft. There is no tenderness.  Lymphadenopathy:    She has no cervical adenopathy.  Neurological: She is alert and oriented to person, place, and time.  Skin: Skin is warm and dry.  Psychiatric: She has a normal mood and affect.  Nursing note and vitals reviewed.  ED Treatments / Results  DIAGNOSTIC STUDIES: Oxygen Saturation is 100% on RA, normal by my interpretation.   COORDINATION OF CARE: 11:10 PM-Discussed next steps with pt. Pt verbalized understanding and is agreeable with the plan.   Labs (all labs ordered are listed, but only abnormal results are displayed) Labs Reviewed  RAPID STREP SCREEN (NOT AT [facility])  CULTURE, GROUP A STREP [facility])   EKG  EKG Interpretation None      Radiology No results found.  Procedures Procedures   Medications Ordered in ED Medications - No data to  display  Initial Impression / Assessment and Plan / ED Course  I have reviewed the triage vital signs and the nursing notes.  Pertinent labs & imaging results that were available during my care of the patient were reviewed by me and considered in my medical decision making (see chart for details).     Patient presents with multiple complaints. She is nontoxic on exam. Vital signs reassuring. Given her history and clinical picture, suspect acute viral etiology. No rash on the palms or soles to suspect tickborne illness. She has had recent sick contacts. Given conjunctivitis and sore throat as well as headache, feel this is likely viral in nature. Strep screen is negative.  Patient was given Polytrim for her conjunctivitis. Other supportive measures recommended at home.  After history, exam, and medical workup I feel the patient has been appropriately medically  screened and is safe for discharge home. Pertinent diagnoses were discussed with the patient. Patient was given return precautions.   Final Clinical Impressions(s) / ED Diagnoses   Final diagnoses:  Viral pharyngitis  Acute viral conjunctivitis of right eye  Tick bite, initial encounter   New Prescriptions New Prescriptions   IBUPROFEN (ADVIL,MOTRIN) 600 MG TABLET    Take 1 tablet (600 mg total) by mouth every 6 (six) hours as needed.   TRIMETHOPRIM-POLYMYXIN B (POLYTRIM) OPHTHALMIC SOLUTION    Place 1 drop into the right eye every 4 (four) hours.   I personally performed the services described in this documentation, which was scribed in my presence. The recorded information has been reviewed and is accurate.     Shon BatonHorton, Tyffani Foglesong F, MD 03/10/17 [phone]

## 2017-03-09 NOTE — ED Triage Notes (Signed)
PT presents to ED with multiple complaints of headache, body aches tick bite nausea.

## 2017-03-09 NOTE — Discharge Instructions (Signed)
You likely have a viral illness.  However, you will be given antibiotics drops for conjunctivitis.  Make sure to wash her hands. Ibuprofen as needed for pain.

## 2017-03-12 LAB — CULTURE, GROUP A STREP (THRC)

## 2017-08-13 ENCOUNTER — Emergency Department (HOSPITAL_BASED_OUTPATIENT_CLINIC_OR_DEPARTMENT_OTHER)
Admission: EM | Admit: 2017-08-13 | Discharge: 2017-08-13 | Disposition: A | Discharge status: Home / Self Care | Payer: Self-pay | Attending: Emergency Medicine | Admitting: Emergency Medicine

## 2017-08-13 ENCOUNTER — Emergency Department (HOSPITAL_BASED_OUTPATIENT_CLINIC_OR_DEPARTMENT_OTHER): Payer: Self-pay

## 2017-08-13 ENCOUNTER — Encounter (HOSPITAL_BASED_OUTPATIENT_CLINIC_OR_DEPARTMENT_OTHER): Payer: Self-pay | Admitting: Emergency Medicine

## 2017-08-13 DIAGNOSIS — Y939 Activity, unspecified: Secondary | ICD-10-CM | POA: Insufficient documentation

## 2017-08-13 DIAGNOSIS — Y929 Unspecified place or not applicable: Secondary | ICD-10-CM | POA: Insufficient documentation

## 2017-08-13 DIAGNOSIS — S93401A Sprain of unspecified ligament of right ankle, initial encounter: Secondary | ICD-10-CM | POA: Insufficient documentation

## 2017-08-13 DIAGNOSIS — J45909 Unspecified asthma, uncomplicated: Secondary | ICD-10-CM | POA: Insufficient documentation

## 2017-08-13 DIAGNOSIS — Z79899 Other long term (current) drug therapy: Secondary | ICD-10-CM | POA: Insufficient documentation

## 2017-08-13 DIAGNOSIS — Y999 Unspecified external cause status: Secondary | ICD-10-CM | POA: Insufficient documentation

## 2017-08-13 DIAGNOSIS — Z91040 Latex allergy status: Secondary | ICD-10-CM | POA: Insufficient documentation

## 2017-08-13 DIAGNOSIS — T7421XA Adult sexual abuse, confirmed, initial encounter: Secondary | ICD-10-CM | POA: Insufficient documentation

## 2017-08-13 LAB — COMPREHENSIVE METABOLIC PANEL
ALK PHOS: 43 U/L (ref 38–126)
ALT: 24 U/L (ref 14–54)
AST: 34 U/L (ref 15–41)
Albumin: 4.4 g/dL (ref 3.5–5.0)
Anion gap: 6 (ref 5–15)
BUN: 7 mg/dL (ref 6–20)
CALCIUM: 9.1 mg/dL (ref 8.9–10.3)
CO2: 26 mmol/L (ref 22–32)
CREATININE: 0.6 mg/dL (ref 0.44–1.00)
Chloride: 101 mmol/L (ref 101–111)
Glucose, Bld: 96 mg/dL (ref 65–99)
Potassium: 4 mmol/L (ref 3.5–5.1)
Sodium: 133 mmol/L — ABNORMAL LOW (ref 135–145)
Total Bilirubin: 0.6 mg/dL (ref 0.3–1.2)
Total Protein: 7.9 g/dL (ref 6.5–8.1)

## 2017-08-13 LAB — URINALYSIS, ROUTINE W REFLEX MICROSCOPIC
BILIRUBIN URINE: NEGATIVE
Glucose, UA: NEGATIVE mg/dL
HGB URINE DIPSTICK: NEGATIVE
Ketones, ur: NEGATIVE mg/dL
Leukocytes, UA: NEGATIVE
Nitrite: NEGATIVE
PROTEIN: NEGATIVE mg/dL
Specific Gravity, Urine: 1.015 (ref 1.005–1.030)
pH: 8 (ref 5.0–8.0)

## 2017-08-13 LAB — RAPID HIV SCREEN (HIV 1/2 AB+AG)
HIV 1/2 Antibodies: NONREACTIVE
HIV-1 P24 Antigen - HIV24: NONREACTIVE

## 2017-08-13 LAB — PREGNANCY, URINE: PREG TEST UR: NEGATIVE

## 2017-08-13 MED ORDER — ONDANSETRON 4 MG PO TBDP
4.0000 mg | ORAL_TABLET | Freq: Once | ORAL | Status: AC
  Administered 2017-08-13: 4 mg via ORAL
  Filled 2017-08-13: qty 1

## 2017-08-13 MED ORDER — ELVITEG-COBIC-EMTRICIT-TENOFAF 150-150-200-10 MG PO TABS: 1.0000 | ORAL_TABLET | Freq: Every day | ORAL | 0 refills | Status: DC

## 2017-08-13 MED ORDER — AZITHROMYCIN 250 MG PO TABS
1000.0000 mg | ORAL_TABLET | Freq: Once | ORAL | Status: AC
  Administered 2017-08-13: 1000 mg via ORAL
  Filled 2017-08-13: qty 4

## 2017-08-13 MED ORDER — ONDANSETRON HCL 4 MG PO TABS: 4.0000 mg | ORAL_TABLET | Freq: Three times a day (TID) | ORAL | 0 refills | Status: DC | PRN

## 2017-08-13 MED ORDER — LIDOCAINE HCL (PF) 1 % IJ SOLN
0.9000 mL | Freq: Once | INTRAMUSCULAR | Status: AC
  Administered 2017-08-13: 0.9 mL

## 2017-08-13 MED ORDER — METRONIDAZOLE 500 MG PO TABS
2000.0000 mg | ORAL_TABLET | Freq: Once | ORAL | Status: AC
  Administered 2017-08-13: 2000 mg via ORAL
  Filled 2017-08-13: qty 4

## 2017-08-13 MED ORDER — ULIPRISTAL ACETATE 30 MG PO TABS
30.0000 mg | ORAL_TABLET | Freq: Once | ORAL | Status: AC
  Administered 2017-08-13: 30 mg via ORAL
  Filled 2017-08-13: qty 1

## 2017-08-13 MED ORDER — CEFTRIAXONE SODIUM 250 MG IJ SOLR
250.0000 mg | Freq: Once | INTRAMUSCULAR | Status: AC
  Administered 2017-08-13: 250 mg via INTRAMUSCULAR
  Filled 2017-08-13: qty 250
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
## 2017-08-14 LAB — RPR: RPR: NONREACTIVE

## 2017-08-14 LAB — HEPATITIS C ANTIBODY: HCV Ab: <0.1 s/co ratio (ref 0.0–0.9)

## 2017-08-14 LAB — HEPATITIS B SURFACE ANTIGEN: Hepatitis B Surface Ag: NEGATIVE

## 2017-08-14 NOTE — SANE Note (Signed)
ON 08/14/2017, AT APPROXIMATELY 1316 HOURS, I SPOKE WITH JESSICA FROM THE [employer] [employer] [facility]), IN REFERENCE TO OBTAINING THE VOUCHER INFORMATION FOR HIV nPEP GENVOYA.  THE FOLLOWING VOUCHER INFORMATION WAS PROVIDED:  MEMBER #:  [id number]  BIN #:  [id number]  PCN:  [id number]  GROUP #:  [id number]   THE ABOVE INFORMATION WAS THEN SENT, VIA EMAIL, TO THE [facility].
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
## 2017-10-20 ENCOUNTER — Encounter (HOSPITAL_BASED_OUTPATIENT_CLINIC_OR_DEPARTMENT_OTHER): Payer: Self-pay | Admitting: Emergency Medicine

## 2017-10-20 ENCOUNTER — Emergency Department (HOSPITAL_BASED_OUTPATIENT_CLINIC_OR_DEPARTMENT_OTHER)
Admission: EM | Admit: 2017-10-20 | Discharge: 2017-10-20 | Disposition: A | Discharge status: Home / Self Care | Payer: Non-veteran care | Attending: Emergency Medicine | Admitting: Emergency Medicine

## 2017-10-20 ENCOUNTER — Other Ambulatory Visit: Payer: Self-pay

## 2017-10-20 ENCOUNTER — Emergency Department (HOSPITAL_BASED_OUTPATIENT_CLINIC_OR_DEPARTMENT_OTHER): Payer: Non-veteran care

## 2017-10-20 DIAGNOSIS — J45909 Unspecified asthma, uncomplicated: Secondary | ICD-10-CM | POA: Insufficient documentation

## 2017-10-20 DIAGNOSIS — R0981 Nasal congestion: Secondary | ICD-10-CM | POA: Insufficient documentation

## 2017-10-20 DIAGNOSIS — Z91040 Latex allergy status: Secondary | ICD-10-CM | POA: Diagnosis not present

## 2017-10-20 DIAGNOSIS — R51 Headache: Secondary | ICD-10-CM | POA: Diagnosis not present

## 2017-10-20 DIAGNOSIS — R059 Cough, unspecified: Secondary | ICD-10-CM

## 2017-10-20 DIAGNOSIS — R05 Cough: Secondary | ICD-10-CM | POA: Diagnosis not present

## 2017-10-20 DIAGNOSIS — Z79899 Other long term (current) drug therapy: Secondary | ICD-10-CM | POA: Diagnosis not present

## 2017-10-20 DIAGNOSIS — R070 Pain in throat: Secondary | ICD-10-CM | POA: Diagnosis not present

## 2017-10-20 DIAGNOSIS — J3489 Other specified disorders of nose and nasal sinuses: Secondary | ICD-10-CM | POA: Insufficient documentation

## 2017-10-20 MED ORDER — BENZONATATE 100 MG PO CAPS: 100.0000 mg | ORAL_CAPSULE | Freq: Three times a day (TID) | ORAL | 0 refills | Status: DC

## 2017-10-20 MED ORDER — AMOXICILLIN-POT CLAVULANATE 875-125 MG PO TABS: 1.0000 | ORAL_TABLET | Freq: Two times a day (BID) | ORAL | 0 refills | Status: DC

## 2017-10-20 MED FILL — BENZONATATE 100 MG CAPSULE: 100 | 7 days supply | Qty: 21 | Fill #0

## 2017-10-20 MED FILL — AMOX-CLAV 875-125 MG TABLET: 875-125 | 7 days supply | Qty: 14 | Fill #0

## 2017-10-20 NOTE — ED Provider Notes (Signed)
[facility] EMERGENCY DEPARTMENT Provider Note   CSN: [id number] Arrival date & time: 10/20/17  1048     History   Chief Complaint Chief Complaint  Patient presents with  . Cough    HPI Nicole Barron is a 31 y.o. female.  Patient presents to the emergency department with a chief complaint of sore throat and cough.  She reports having the symptoms for 4 weeks.  She states that they wax and wane.  She also reports having some sinus congestion and sinus headache.  She has tried taking OTC medication with no relief.  She denies any measured fever.  She denies any difficulty breathing.  Denies any abdominal pain, nausea, or vomiting.  She states that the cough caused her to recently lose her voice.  She denies any other associated symptoms.   The history is provided by the patient. No language interpreter was used.    Past Medical History:  Diagnosis Date  . Asthma   . Fibromyalgia   . Hypotension     There are no active problems to display for this patient.   Past Surgical History:  Procedure Laterality Date  . KNEE ARTHROSCOPY Left     OB History    Gravida Para Term Preterm AB Living   1             SAB TAB Ectopic Multiple Live Births                   Home Medications    Prior to Admission medications   Medication Sig Start Date End Date Taking? Authorizing Provider  albuterol (PROVENTIL HFA;VENTOLIN HFA) 108 (90 Base) MCG/ACT inhaler Inhale into the lungs every 6 (six) hours as needed for wheezing or shortness of breath.    [provider]  elvitegravir-cobicistat-emtricitabine-tenofovir (GENVOYA) 150-150-200-10 MG TABS tablet Take 1 tablet by mouth daily with breakfast. 08/13/17   Loren RacerYelverton, David, MD  fluticasone (FLOVENT HFA) 110 MCG/ACT inhaler Inhale into the lungs 2 (two) times daily.    [provider]    Family History No family history on file.  Social History Social History   Tobacco Use  . Smoking status:  Never Smoker  . Smokeless tobacco: Never Used  Substance Use Topics  . Alcohol use: No  . Drug use: No     Allergies   Latex   Review of Systems Review of Systems  All other systems reviewed and are negative.    Physical Exam Updated Vital Signs BP 123/82   Pulse 81   Temp 98.3 F (36.8 C) (Oral)   Resp 16   Ht 5\' 4"  (1.626 m)   Wt 76.2 kg (168 lb)   LMP 10/06/2017   SpO2 98%   BMI 28.84 kg/m   Physical Exam Physical Exam  Constitutional: Pt  is oriented to person, place, and time. Appears well-developed and well-nourished. No distress.  HENT:  Head: Normocephalic and atraumatic.  Right Ear: Tympanic membrane, external ear and ear canal normal.  Left Ear: Tympanic membrane, external ear and ear canal normal.  Nose: Mucosal edema and moderate rhinorrhea present. No epistaxis. Right sinus exhibits no maxillary sinus tenderness and no frontal sinus tenderness. Left sinus exhibits no maxillary sinus tenderness and no frontal sinus tenderness.  Mouth/Throat: Uvula is midline and mucous membranes are normal. Mucous membranes are not pale and not cyanotic. No oropharyngeal exudate, posterior oropharyngeal edema, posterior oropharyngeal erythema or tonsillar abscesses.  Eyes: Conjunctivae are normal. Pupils are equal, round,  and reactive to light.  Neck: Normal range of motion and full passive range of motion without pain.  Cardiovascular: Normal rate and intact distal pulses.   Pulmonary/Chest: Effort normal and breath sounds normal. No stridor.  Clear and equal breath sounds without focal wheezes, rhonchi, rales  Abdominal: Soft. Bowel sounds are normal. There is no tenderness.  Musculoskeletal: Normal range of motion.  Lymphadenopathy:    Pthas no cervical adenopathy.  Neurological: Pt is alert and oriented to person, place, and time.  Skin: Skin is warm and dry. No rash noted. Pt is not diaphoretic.  Psychiatric: Normal mood and affect.  Nursing note and vitals  reviewed.    ED Treatments / Results  Labs (all labs ordered are listed, but only abnormal results are displayed) Labs Reviewed - No data to display  EKG  EKG Interpretation None       Radiology Dg Chest 2 View  Result Date: 10/20/2017 CLINICAL DATA:  31 year old female with history of productive cough and congestion for the past 3 weeks. EXAM: CHEST  2 VIEW COMPARISON:  No priors. FINDINGS: Lung volumes are normal. No consolidative airspace disease. No pleural effusions. No pneumothorax. No pulmonary nodule or mass noted. Pulmonary vasculature and the cardiomediastinal silhouette are within normal limits. IMPRESSION: No radiographic evidence of acute cardiopulmonary disease. Electronically Signed   By: Trudie Reedaniel  Entrikin M.D.   On: 10/20/2017 11:25    Procedures Procedures (including critical care time)  Medications Ordered in ED Medications - No data to display   Initial Impression / Assessment and Plan / ED Course  I have reviewed the triage vital signs and the nursing notes.  Pertinent labs & imaging results that were available during my care of the patient were reviewed by me and considered in my medical decision making (see chart for details).     Pt CXR negative for acute infiltrate. Patients symptoms are consistent with URI, however, given length of symptoms, she may benefit from antibiotic therapy.  Pt will be discharged with augmentin for possible sinusitis and persistent cough.  Will als prescribe tessalon.  Verbalizes understanding and is agreeable with plan. Pt is hemodynamically stable & in NAD prior to dc.   Final Clinical Impressions(s) / ED Diagnoses   Final diagnoses:  Cough  Sinus pressure    ED Discharge Orders        Ordered    amoxicillin-clavulanate (AUGMENTIN) 875-125 MG tablet  Every 12 hours     10/20/17 1316    benzonatate (TESSALON) 100 MG capsule  Every 8 hours     10/20/17 1316       Roxy HorsemanBrowning, Nizar Cutler, PA-C 10/20/17 1317      Tegeler, Canary Brimhristopher J, MD 10/20/17 1744

## 2017-10-20 NOTE — ED Triage Notes (Signed)
Pt having sore throat, runny nose congestion and cough for over a month.  Pt having some pain in her chest with cough.

## 2017-11-04 ENCOUNTER — Emergency Department (HOSPITAL_BASED_OUTPATIENT_CLINIC_OR_DEPARTMENT_OTHER): Payer: Non-veteran care

## 2017-11-04 ENCOUNTER — Encounter (HOSPITAL_BASED_OUTPATIENT_CLINIC_OR_DEPARTMENT_OTHER): Payer: Self-pay | Admitting: Emergency Medicine

## 2017-11-04 ENCOUNTER — Emergency Department (HOSPITAL_BASED_OUTPATIENT_CLINIC_OR_DEPARTMENT_OTHER)
Admission: EM | Admit: 2017-11-04 | Discharge: 2017-11-04 | Disposition: A | Discharge status: Home / Self Care | Payer: Non-veteran care | Attending: Emergency Medicine | Admitting: Emergency Medicine

## 2017-11-04 ENCOUNTER — Other Ambulatory Visit: Payer: Self-pay

## 2017-11-04 DIAGNOSIS — J45909 Unspecified asthma, uncomplicated: Secondary | ICD-10-CM | POA: Diagnosis not present

## 2017-11-04 DIAGNOSIS — Z79899 Other long term (current) drug therapy: Secondary | ICD-10-CM | POA: Diagnosis not present

## 2017-11-04 DIAGNOSIS — J069 Acute upper respiratory infection, unspecified: Secondary | ICD-10-CM

## 2017-11-04 DIAGNOSIS — R05 Cough: Secondary | ICD-10-CM | POA: Diagnosis present

## 2017-11-04 DIAGNOSIS — N390 Urinary tract infection, site not specified: Secondary | ICD-10-CM | POA: Insufficient documentation

## 2017-11-04 LAB — URINALYSIS, ROUTINE W REFLEX MICROSCOPIC
Glucose, UA: NEGATIVE mg/dL
KETONES UR: 40 mg/dL — AB
Nitrite: NEGATIVE
PH: 6 (ref 5.0–8.0)
Protein, ur: 100 mg/dL — AB
Specific Gravity, Urine: 1.02 (ref 1.005–1.030)

## 2017-11-04 LAB — URINALYSIS, MICROSCOPIC (REFLEX)

## 2017-11-04 LAB — PREGNANCY, URINE: Preg Test, Ur: NEGATIVE

## 2017-11-04 MED ORDER — FLUTICASONE PROPIONATE 50 MCG/ACT NA SUSP: 1.0000 | Freq: Every day | NASAL | 2 refills | Status: DC

## 2017-11-04 MED ORDER — CEPHALEXIN 500 MG PO CAPS: 500.0000 mg | ORAL_CAPSULE | Freq: Two times a day (BID) | ORAL | 0 refills | Status: AC

## 2017-11-04 MED ORDER — BENZONATATE 100 MG PO CAPS: 200.0000 mg | ORAL_CAPSULE | Freq: Three times a day (TID) | ORAL | 0 refills | Status: DC

## 2017-11-04 MED ORDER — SODIUM CHLORIDE 0.9 % IV BOLUS (SEPSIS)
1000.0000 mL | Freq: Once | INTRAVENOUS | Status: AC
  Administered 2017-11-04: 1000 mL via INTRAVENOUS

## 2017-11-04 MED ORDER — ACETAMINOPHEN 325 MG PO TABS
650.0000 mg | ORAL_TABLET | Freq: Once | ORAL | Status: AC | PRN
  Administered 2017-11-04: 650 mg via ORAL
  Filled 2017-11-04: qty 2

## 2017-11-04 NOTE — ED Provider Notes (Signed)
[facility] EMERGENCY DEPARTMENT Provider Note   CSN: [id number] Arrival date & time: 11/04/17  1726     History   Chief Complaint Chief Complaint  Patient presents with  . Cough  . Dysuria    HPI Nicole Barron is a 32 y.o. female with a past medical history of asthma, who presents to ED for evaluation of one-week history of productive cough with green mucus, nasal congestion, sore throat, ear pain, fever and body aches.  She also has noticed 2-day history of dysuria and urinary frequency. Concerning her URI symptoms, she states that she has had intermittent sometimes similar to this for the past month.  Sick contacts with kids at home with similar symptoms.  She did not receive her influenza vaccine this year.  She has tried Tylenol Cold and sinus over-the-counter with significant improvement in her symptoms.  She denies any chest pain, shortness of breath, wheezing, vomiting, abdominal pain, hemoptysis, prior cardiac issues. Regarding her dysuria, she states that this feels similar to her prior UTIs.  She gets them every several months.  She has not taken any medications to help with symptoms.  HPI  Past Medical History:  Diagnosis Date  . Asthma   . Fibromyalgia   . Hypotension     There are no active problems to display for this patient.   Past Surgical History:  Procedure Laterality Date  . KNEE ARTHROSCOPY Left     OB History    Gravida Para Term Preterm AB Living   1             SAB TAB Ectopic Multiple Live Births                   Home Medications    Prior to Admission medications   Medication Sig Start Date End Date Taking? Authorizing Provider  albuterol (PROVENTIL HFA;VENTOLIN HFA) 108 (90 Base) MCG/ACT inhaler Inhale into the lungs every 6 (six) hours as needed for wheezing or shortness of breath.    [provider]  amoxicillin-clavulanate (AUGMENTIN) 875-125 MG tablet Take 1 tablet by mouth every 12 (twelve) hours. 10/20/17    Roxy HorsemanBrowning, Robert, PA-C  benzonatate (TESSALON) 100 MG capsule Take 2 capsules (200 mg total) by mouth every 8 (eight) hours. 11/04/17   Daniya Aramburo, PA-C  cephALEXin (KEFLEX) 500 MG capsule Take 1 capsule (500 mg total) by mouth 2 (two) times daily for 7 days. 11/04/17 11/11/17  Dietrich PatesKhatri, Alayziah Tangeman, PA-C  elvitegravir-cobicistat-emtricitabine-tenofovir (GENVOYA) 150-150-200-10 MG TABS tablet Take 1 tablet by mouth daily with breakfast. 08/13/17   Loren RacerYelverton, David, MD  fluticasone [facility](FLONASE) 50 MCG/ACT nasal spray Place 1 spray into both nostrils daily. 11/04/17   Selinda Korzeniewski, PA-C  fluticasone (FLOVENT HFA) 110 MCG/ACT inhaler Inhale into the lungs 2 (two) times daily.    [provider]    Family History No family history on file.  Social History Social History   Tobacco Use  . Smoking status: Never Smoker  . Smokeless tobacco: Never Used  Substance Use Topics  . Alcohol use: No  . Drug use: No     Allergies   Latex   Review of Systems Review of Systems  Constitutional: Positive for chills and fever. Negative for appetite change.  HENT: Positive for congestion, postnasal drip, rhinorrhea and sore throat. Negative for drooling, ear pain, nosebleeds, sneezing, trouble swallowing and voice change.   Eyes: Negative for photophobia and visual disturbance.  Respiratory: Positive for cough. Negative for chest tightness, shortness  of breath and wheezing.   Cardiovascular: Negative for chest pain and palpitations.  Gastrointestinal: Negative for abdominal pain, blood in stool, constipation, diarrhea, nausea and vomiting.  Genitourinary: Positive for dysuria and frequency. Negative for flank pain, hematuria and urgency.  Musculoskeletal: Positive for myalgias.  Skin: Negative for rash.  Neurological: Negative for dizziness, weakness and light-headedness.     Physical Exam Updated Vital Signs BP 116/73 (BP Location: Right Arm)   Pulse 94   Temp 98.7 F (37.1 C) (Oral)   Resp 18   Ht  5\' 4"  (1.626 m)   Wt 77.1 kg (170 lb)   LMP 10/26/2017   SpO2 98%   BMI 29.18 kg/m   Physical Exam  Constitutional: She appears well-developed and well-nourished. No distress.  HENT:  Head: Normocephalic and atraumatic.  Right Ear: A middle ear effusion is present.  Left Ear: A middle ear effusion is present.  Nose: Rhinorrhea present.  Mouth/Throat: Uvula is midline. No oral lesions. No trismus in the jaw. No uvula swelling. Posterior oropharyngeal erythema present. No tonsillar exudate.  Patient does not appear to be in acute distress. No trismus or drooling present. No pooling of secretions. Patient is tolerating secretions and is not in respiratory distress. No neck pain or tenderness to palpation of the neck. Full active and passive range of motion of the neck. No evidence of RPA or PTA.  Eyes: Conjunctivae and EOM are normal. Right eye exhibits no discharge. Left eye exhibits no discharge. No scleral icterus.  Neck: Normal range of motion. Neck supple.  Cardiovascular: Normal rate, regular rhythm, normal heart sounds and intact distal pulses. Exam reveals no gallop and no friction rub.  No murmur heard. Pulmonary/Chest: Effort normal and breath sounds normal. No respiratory distress.  Abdominal: Soft. Bowel sounds are normal. She exhibits no distension. There is no tenderness. There is no guarding.  No CVA tenderness noted.  Musculoskeletal: Normal range of motion. She exhibits no edema.  Neurological: She is alert. She exhibits normal muscle tone. Coordination normal.  Skin: Skin is warm and dry. No rash noted.  Psychiatric: She has a normal mood and affect.  Nursing note and vitals reviewed.    ED Treatments / Results  Labs (all labs ordered are listed, but only abnormal results are displayed) Labs Reviewed  URINALYSIS, ROUTINE W REFLEX MICROSCOPIC - Abnormal; Notable for the following components:      Result Value   APPearance CLOUDY (*)    Hgb urine dipstick LARGE (*)     Bilirubin Urine SMALL (*)    Ketones, ur 40 (*)    Protein, ur 100 (*)    Leukocytes, UA MODERATE (*)    All other components within normal limits  URINALYSIS, MICROSCOPIC (REFLEX) - Abnormal; Notable for the following components:   Bacteria, UA FEW (*)    Squamous Epithelial / LPF 0-5 (*)    All other components within normal limits  PREGNANCY, URINE    EKG  EKG Interpretation None       Radiology Dg Chest 2 View  Result Date: 11/04/2017 CLINICAL DATA:  Cough, fever for 1 month EXAM: CHEST  2 VIEW COMPARISON:  October 20, 2017 FINDINGS: The heart size and mediastinal contours are within normal limits. Both lungs are clear. The visualized skeletal structures are unremarkable. IMPRESSION: No active cardiopulmonary disease. Electronically Signed   By: Sherian Rein M.D.   On: 11/04/2017 17:55    Procedures Procedures (including critical care time)  Medications Ordered in ED Medications  acetaminophen (TYLENOL) tablet 650 mg (650 mg Oral Given 11/04/17 1735)  sodium chloride 0.9 % bolus 1,000 mL (1,000 mLs Intravenous New Bag/Given 11/04/17 1921)     Initial Impression / Assessment and Plan / ED Course  I have reviewed the triage vital signs and the nursing notes.  Pertinent labs & imaging results that were available during my care of the patient were reviewed by me and considered in my medical decision making (see chart for details).     Patient presents to ED for evaluation of one-week history of productive cough, green mucus, nasal congestion, sore throat, ear pain, fever and body aches.  Also reports 2-day history of dysuria and urinary frequency.  She denies any chronic medical issues or daily medication use.  Here she is febrile to 101.  She is tachycardic as well.  Urinalysis with evidence of UTI as well as dehydration showing ketones and protein.  Chest x-ray returned as negative for acute abnormality including pneumonia.  Patient is overall well-appearing.  No evidence  of RPA or PTA during exam of the posterior oropharynx.  No abdominal tenderness to palpation or flank tenderness noted.  Will give Keflex for UTI as well as symptomatic treatment for what appears to be a viral URI.  Advised to follow-up at [facility] for further evaluation.  Fever and heart rate improved with supportive measures including antipyretics and fluids.  Patient appears stable for discharge at this time.  Strict return precautions given.  Final Clinical Impressions(s) / ED Diagnoses   Final diagnoses:  Lower urinary tract infectious disease  Viral upper respiratory tract infection    ED Discharge Orders        Ordered    cephALEXin (KEFLEX) 500 MG capsule  2 times daily     11/04/17 1952    benzonatate (TESSALON) 100 MG capsule  Every 8 hours     11/04/17 1952    fluticasone (FLONASE) 50 MCG/ACT nasal spray  Daily     11/04/17 1952     Portions of this note were generated with Dragon dictation software. Dictation errors may occur despite best attempts at proofreading.    Dietrich Pates, PA-C 11/04/17 1956    Pricilla Loveless, MD 11/07/17 1037

## 2017-11-04 NOTE — Discharge Instructions (Signed)
Please read attached information regarding your condition. Take Keflex twice daily for 7 days for UTI. Take Tessalon Perles as needed for cough and use Flonase nasal spray as needed for nasal congestion. [facility] if symptoms persist. Return to ED for worsening symptoms, chest pain, shortness of breath, severe abdominal pain, vomiting, lightheadedness or loss of consciousness.

## 2017-11-04 NOTE — ED Triage Notes (Signed)
Fever, chills, cough, congestion, x 1 week. Also reports she has to strain to urinate.
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
## 2018-10-30 ENCOUNTER — Encounter (HOSPITAL_BASED_OUTPATIENT_CLINIC_OR_DEPARTMENT_OTHER): Payer: Self-pay | Admitting: Emergency Medicine

## 2018-10-30 ENCOUNTER — Other Ambulatory Visit: Payer: Self-pay

## 2018-10-30 ENCOUNTER — Emergency Department (HOSPITAL_BASED_OUTPATIENT_CLINIC_OR_DEPARTMENT_OTHER)
Admission: EM | Admit: 2018-10-30 | Discharge: 2018-10-30 | Disposition: A | Discharge status: Home / Self Care | Payer: Non-veteran care | Attending: Emergency Medicine | Admitting: Emergency Medicine

## 2018-10-30 DIAGNOSIS — B3731 Acute candidiasis of vulva and vagina: Secondary | ICD-10-CM

## 2018-10-30 DIAGNOSIS — Z91040 Latex allergy status: Secondary | ICD-10-CM | POA: Insufficient documentation

## 2018-10-30 DIAGNOSIS — B373 Candidiasis of vulva and vagina: Secondary | ICD-10-CM | POA: Diagnosis not present

## 2018-10-30 DIAGNOSIS — Z79899 Other long term (current) drug therapy: Secondary | ICD-10-CM | POA: Insufficient documentation

## 2018-10-30 DIAGNOSIS — J45909 Unspecified asthma, uncomplicated: Secondary | ICD-10-CM | POA: Insufficient documentation

## 2018-10-30 DIAGNOSIS — I10 Essential (primary) hypertension: Secondary | ICD-10-CM | POA: Insufficient documentation

## 2018-10-30 DIAGNOSIS — R300 Dysuria: Secondary | ICD-10-CM | POA: Diagnosis present

## 2018-10-30 HISTORY — DX: Urinary tract infection, site not specified: N39.0

## 2018-10-30 LAB — WET PREP, GENITAL
Clue Cells Wet Prep HPF POC: NONE SEEN
SPERM: NONE SEEN
Trich, Wet Prep: NONE SEEN
Yeast Wet Prep HPF POC: NONE SEEN

## 2018-10-30 LAB — PREGNANCY, URINE: Preg Test, Ur: NEGATIVE

## 2018-10-30 LAB — URINALYSIS, ROUTINE W REFLEX MICROSCOPIC
BILIRUBIN URINE: NEGATIVE
GLUCOSE, UA: NEGATIVE mg/dL
HGB URINE DIPSTICK: NEGATIVE
KETONES UR: 15 mg/dL — AB
Leukocytes, UA: NEGATIVE
Nitrite: NEGATIVE
PH: 6 (ref 5.0–8.0)
PROTEIN: NEGATIVE mg/dL
Specific Gravity, Urine: 1.025 (ref 1.005–1.030)

## 2018-10-30 MED ORDER — FLUCONAZOLE 200 MG PO TABS: 200.0000 mg | ORAL_TABLET | Freq: Every day | ORAL | 0 refills | Status: AC

## 2018-10-30 MED FILL — FLUCONAZOLE 200 MG TAB: 200 | 3 days supply | Qty: 3 | Fill #0

## 2018-10-30 NOTE — Discharge Instructions (Signed)
Also use over the counter monistat or something similar

## 2018-10-30 NOTE — ED Provider Notes (Signed)
[facility] EMERGENCY DEPARTMENT Provider Note   CSN: [id number] Arrival date & time: 10/30/18  0930     History   Chief Complaint No chief complaint on file.   HPI Nicole Barron is a 32 y.o. female.  The history is provided by the patient.  Dysuria   This is a new problem. Episode onset: 4-5 days ago. The problem occurs every urination. The problem has been gradually worsening. The quality of the pain is described as burning, stabbing and shooting. The pain is at a severity of 5/10. The pain is moderate. There has been no fever. She is sexually active. There is no history of pyelonephritis. Associated symptoms include discharge, frequency, hesitancy and urgency. Pertinent negatives include no chills, no sweats, no nausea, no vomiting and no flank pain. Associated symptoms comments: States some white discharge in urine but no vaginal discharge that she is aware of.  Sexually active with the same partner for a long time.  Only 1 partner but does not use protection.  LMP was Nov 25th. She has tried nothing for the symptoms. Her past medical history does not include kidney stones, urological procedure or catheterization.    Past Medical History:  Diagnosis Date  . Asthma   . Fibromyalgia   . Hypotension     There are no active problems to display for this patient.   Past Surgical History:  Procedure Laterality Date  . KNEE ARTHROSCOPY Left      OB History    Gravida  1   Para      Term      Preterm      AB      Living        SAB      TAB      Ectopic      Multiple      Live Births               Home Medications    Prior to Admission medications   Medication Sig Start Date End Date Taking? Authorizing Provider  albuterol (PROVENTIL HFA;VENTOLIN HFA) 108 (90 Base) MCG/ACT inhaler Inhale into the lungs every 6 (six) hours as needed for wheezing or shortness of breath.    [provider]  amoxicillin-clavulanate (AUGMENTIN)  875-125 MG tablet Take 1 tablet by mouth every 12 (twelve) hours. 10/20/17   Roxy Horseman, PA-C  benzonatate (TESSALON) 100 MG capsule Take 2 capsules (200 mg total) by mouth every 8 (eight) hours. 11/04/17   Khatri, Hillary Bow, PA-C  elvitegravir-cobicistat-emtricitabine-tenofovir (GENVOYA) 150-150-200-10 MG TABS tablet Take 1 tablet by mouth daily with breakfast. 08/13/17   Loren Racer, MD  fluticasone [facility]) 50 MCG/ACT nasal spray Place 1 spray into both nostrils daily. 11/04/17   Khatri, Hina, PA-C  fluticasone (FLOVENT HFA) 110 MCG/ACT inhaler Inhale into the lungs 2 (two) times daily.    [provider]    Family History No family history on file.  Social History Social History   Tobacco Use  . Smoking status: Never Smoker  . Smokeless tobacco: Never Used  Substance Use Topics  . Alcohol use: No  . Drug use: No     Allergies   Latex   Review of Systems Review of Systems  Constitutional: Negative for chills.  Gastrointestinal: Negative for nausea and vomiting.  Genitourinary: Positive for dysuria, frequency, hesitancy and urgency. Negative for flank pain.  All other systems reviewed and are negative.    Physical Exam Updated Vital Signs BP 129/79 (  BP Location: Right Arm)   Pulse 93   Temp 98.1 F (36.7 C) (Oral)   Resp 16   Ht 5\' 4"  (1.626 m)   Wt 68 kg   LMP 09/29/2018   SpO2 100%   BMI 25.75 kg/m   Physical Exam Vitals signs and nursing note reviewed.  Constitutional:      General: She is not in acute distress.    Appearance: She is well-developed.  HENT:     Head: Normocephalic and atraumatic.  Eyes:     Pupils: Pupils are equal, round, and reactive to light.  Cardiovascular:     Rate and Rhythm: Normal rate and regular rhythm.     Heart sounds: Normal heart sounds. No murmur. No friction rub.  Pulmonary:     Effort: Pulmonary effort is normal.     Breath sounds: Normal breath sounds. No wheezing or rales.  Abdominal:     General:  Bowel sounds are normal. There is no distension.     Palpations: Abdomen is soft.     Tenderness: There is no abdominal tenderness. There is no right CVA tenderness, left CVA tenderness, guarding or rebound.  Genitourinary:    Labia:        Right: No tenderness or lesion.        Left: No tenderness or lesion.      Vagina: Vaginal discharge present. No tenderness, bleeding or lesions.     Cervix: Discharge present. No cervical motion tenderness.     Uterus: Normal.      Adnexa: Right adnexa normal and left adnexa normal.     Comments: Thick white curd-like discharge Musculoskeletal: Normal range of motion.        General: No tenderness.     Comments: No edema  Skin:    General: Skin is warm and dry.     Findings: No rash.  Neurological:     Mental Status: She is alert and oriented to person, place, and time.     Cranial Nerves: No cranial nerve deficit.  Psychiatric:        Behavior: Behavior normal.      ED Treatments / Results  Labs (all labs ordered are listed, but only abnormal results are displayed) Labs Reviewed  WET PREP, GENITAL - Abnormal; Notable for the following components:      Result Value   WBC, Wet Prep HPF POC MANY (*)    All other components within normal limits  URINALYSIS, ROUTINE W REFLEX MICROSCOPIC - Abnormal; Notable for the following components:   Ketones, ur 15 (*)    All other components within normal limits  PREGNANCY, URINE  GC/CHLAMYDIA PROBE AMP ([place]) NOT AT [facility]    EKG None  Radiology No results found.  Procedures Procedures (including critical care time)  Medications Ordered in ED Medications - No data to display   Initial Impression / Assessment and Plan / ED Course  I have reviewed the triage vital signs and the nursing notes.  Pertinent labs & imaging results that were available during my care of the patient were reviewed by me and considered in my medical decision making (see chart for details).     With  symptoms most consistent with a UTI without evidence of Pyelonephritis.  Patient is sexually active but only has one partner for years.  Lower suspicion for STI.  UA and urine pregnancy test pending.  10:29 AM Patient's UA is negative for signs of infection or pregnancy.  Pelvic exam with  thick curd-like discharge throughout the entire vaginal vault consistent with a yeast infection.  GC and chlamydia sent however low suspicion for STI at this time.  Wet prep showed WBC's but no other issues.  Pt treated with diflucan and monistat.  Final Clinical Impressions(s) / ED Diagnoses   Final diagnoses:  Candidal vaginitis    ED Discharge Orders         Ordered    fluconazole (DIFLUCAN) 200 MG tablet  Daily     10/30/18 1039           Gwyneth SproutPlunkett, Maylynn Orzechowski, MD 10/30/18 1040

## 2018-10-30 NOTE — ED Triage Notes (Signed)
Vaginal irritation and discharge for a few days.  Urinary frequency and then only goes a little bit.  Sts she has had many UTIs.

## 2018-11-02 LAB — GC/CHLAMYDIA PROBE AMP (~~LOC~~) NOT AT ARMC
CHLAMYDIA, DNA PROBE: NEGATIVE
Neisseria Gonorrhea: NEGATIVE

## 2018-12-05 ENCOUNTER — Other Ambulatory Visit: Payer: Self-pay

## 2018-12-05 ENCOUNTER — Encounter (HOSPITAL_BASED_OUTPATIENT_CLINIC_OR_DEPARTMENT_OTHER): Payer: Self-pay

## 2018-12-05 ENCOUNTER — Emergency Department (HOSPITAL_BASED_OUTPATIENT_CLINIC_OR_DEPARTMENT_OTHER)
Admission: EM | Admit: 2018-12-05 | Discharge: 2018-12-05 | Disposition: A | Discharge status: Home / Self Care | Payer: No Typology Code available for payment source | Attending: Emergency Medicine | Admitting: Emergency Medicine

## 2018-12-05 DIAGNOSIS — Z91040 Latex allergy status: Secondary | ICD-10-CM | POA: Diagnosis not present

## 2018-12-05 DIAGNOSIS — J020 Streptococcal pharyngitis: Secondary | ICD-10-CM | POA: Insufficient documentation

## 2018-12-05 DIAGNOSIS — J45909 Unspecified asthma, uncomplicated: Secondary | ICD-10-CM | POA: Insufficient documentation

## 2018-12-05 DIAGNOSIS — J029 Acute pharyngitis, unspecified: Secondary | ICD-10-CM | POA: Diagnosis present

## 2018-12-05 LAB — GROUP A STREP BY PCR: Group A Strep by PCR: DETECTED — AB

## 2018-12-05 MED ORDER — PENICILLIN G BENZATHINE & PROC 1200000 UNIT/2ML IM SUSP
1.2000 10*6.[IU] | Freq: Once | INTRAMUSCULAR | Status: AC
  Administered 2018-12-05: 1.2 10*6.[IU] via INTRAMUSCULAR
  Filled 2018-12-05: qty 2

## 2018-12-05 MED ORDER — DEXAMETHASONE 6 MG PO TABS
10.0000 mg | ORAL_TABLET | Freq: Once | ORAL | Status: AC
  Administered 2018-12-05: 14:00:00 10 mg via ORAL
  Filled 2018-12-05: qty 1

## 2018-12-05 NOTE — Discharge Instructions (Signed)
Alternate ibuprofen and Tylenol as prescribed over-the-counter, as needed for pain and swelling in your throat.  Make sure to stay well-hydrated.  Make sure to change your toothbrush after 3 days.  Do not share food or drinks as strep throat is very contagious.  Please return the emergency department you develop any new or worsening symptoms including lockjaw, drooling, or asymmetry in the back of your throat.

## 2018-12-05 NOTE — ED Notes (Signed)
Pt verbalized understanding of dc instructions.

## 2018-12-05 NOTE — ED Triage Notes (Signed)
Pt c/o sore throat, subjective fevers, body aches that started yesterday. Pt has had flu and strep throat contacts.

## 2018-12-05 NOTE — ED Provider Notes (Signed)
[facility] EMERGENCY DEPARTMENT Provider Note   CSN: [id number] Arrival date & time: 12/05/18  1255     History   Chief Complaint Chief Complaint  Patient presents with  . Sore Throat    HPI Nicole Barron is a 33 y.o. female with history of asthma, fibromyalgia who presents with a 1 day history of sore throat, fever, body aches.  Patient has been around her daughter who gets strep throat regularly.  She also gets strep throat regularly.  She denies any ear pain, cough, nasal congestion.  Patient reports she has abdominal pain, nausea, vomiting at baseline since exploratory surgery a few years ago.  This is not new.  She did not take any medication prior to arrival  HPI  Past Medical History:  Diagnosis Date  . Asthma   . Fibromyalgia   . Hypotension   . UTI (urinary tract infection)     There are no active problems to display for this patient.   Past Surgical History:  Procedure Laterality Date  . KNEE ARTHROSCOPY Left      OB History    Gravida  1   Para      Term      Preterm      AB      Living        SAB      TAB      Ectopic      Multiple      Live Births               Home Medications    Prior to Admission medications   Medication Sig Start Date End Date Taking? Authorizing Provider  albuterol (PROVENTIL HFA;VENTOLIN HFA) 108 (90 Base) MCG/ACT inhaler Inhale into the lungs every 6 (six) hours as needed for wheezing or shortness of breath.    [provider]    Family History No family history on file.  Social History Social History   Tobacco Use  . Smoking status: Never Smoker  . Smokeless tobacco: Never Used  Substance Use Topics  . Alcohol use: Not Currently  . Drug use: Not Currently    Types: Marijuana    Comment: last smoked 1/29     Allergies   Latex   Review of Systems Review of Systems  Constitutional: Positive for fever.  HENT: Positive for sore throat. Negative for congestion.     Respiratory: Negative for cough and shortness of breath.   Cardiovascular: Negative for chest pain.  Gastrointestinal: Positive for abdominal pain, nausea and vomiting (baseline).  Musculoskeletal: Positive for myalgias.     Physical Exam Updated Vital Signs BP 110/72 (BP Location: Right Arm)   Pulse (!) 110   Temp 99 F (37.2 C) (Oral)   Resp 20   Ht 5\' 4"  (1.626 m)   Wt 65.8 kg   LMP 11/27/2018   SpO2 98%   BMI 24.89 kg/m   Physical Exam Vitals signs and nursing note reviewed.  Constitutional:      General: She is not in acute distress.    Appearance: She is well-developed. She is not diaphoretic.  HENT:     Head: Normocephalic and atraumatic.     Right Ear: Tympanic membrane normal.     Left Ear: Tympanic membrane normal.     Nose: No congestion or rhinorrhea.     Mouth/Throat:     Mouth: Mucous membranes are moist.     Pharynx: Pharyngeal swelling and posterior oropharyngeal erythema present.  No oropharyngeal exudate.     Tonsils: Tonsillar exudate present. No tonsillar abscesses. Swelling: 2+ on the right. 2+ on the left.     Comments: Patient with some mild voice change, no trismus or drooling; no peritonsillar abscess, symmetrical without airway obstruction Eyes:     General: No scleral icterus.       Right eye: No discharge.        Left eye: No discharge.     Conjunctiva/sclera: Conjunctivae normal.     Pupils: Pupils are equal, round, and reactive to light.  Neck:     Musculoskeletal: Normal range of motion and neck supple.     Thyroid: No thyromegaly.     Comments: No palpable masses or lymphadenopathy, no tenderness Cardiovascular:     Rate and Rhythm: Normal rate and regular rhythm.     Heart sounds: Normal heart sounds. No murmur. No friction rub. No gallop.   Pulmonary:     Effort: Pulmonary effort is normal. No respiratory distress.     Breath sounds: Normal breath sounds. No stridor. No wheezing or rales.  Abdominal:     General: Bowel sounds are  normal. There is no distension.     Palpations: Abdomen is soft.     Tenderness: There is no abdominal tenderness. There is no guarding or rebound.  Lymphadenopathy:     Cervical: No cervical adenopathy.  Skin:    General: Skin is warm and dry.     Coloration: Skin is not pale.     Findings: No rash.  Neurological:     Mental Status: She is alert.     Coordination: Coordination normal.      ED Treatments / Results  Labs (all labs ordered are listed, but only abnormal results are displayed) Labs Reviewed  GROUP A STREP BY PCR - Abnormal; Notable for the following components:      Result Value   Group A Strep by PCR DETECTED (*)    All other components within normal limits    EKG None  Radiology No results found.  Procedures Procedures (including critical care time)  Medications Ordered in ED Medications  penicillin g procaine-penicillin g benzathine (BICILLIN-CR) injection 600000-600000 units (1.2 Million Units Intramuscular Given 12/05/18 1413)  dexamethasone (DECADRON) tablet 10 mg (10 mg Oral Given 12/05/18 1413)     Initial Impression / Assessment and Plan / ED Course  I have reviewed the triage vital signs and the nursing notes.  Pertinent labs & imaging results that were available during my care of the patient were reviewed by me and considered in my medical decision making (see chart for details).     Pt strep test positive. Pt is tolerating secretions. Presentation not concerning for peritonsillar abscess or spread of infection to deep spaces of the throat; patent airway.  There is some mild voice change, however there is no trismus or drooling.  Posterior pharynx is symmetric.  Pt will be discharged with ibuprofen, Tylenol.  Single dose of Bicillin and Decadron given in the ED.  Specific return precautions discussed. Recommended PCP follow up. Pt appears safe for discharge.   Final Clinical Impressions(s) / ED Diagnoses   Final diagnoses:  Strep pharyngitis      ED Discharge Orders    None       Emi Holes, PA-C 12/05/18 1415    Terrilee Files, MD 12/05/18 Nicholos Johns

## 2019-02-24 ENCOUNTER — Emergency Department (HOSPITAL_BASED_OUTPATIENT_CLINIC_OR_DEPARTMENT_OTHER): Payer: No Typology Code available for payment source

## 2019-02-24 ENCOUNTER — Emergency Department (HOSPITAL_BASED_OUTPATIENT_CLINIC_OR_DEPARTMENT_OTHER)
Admission: EM | Admit: 2019-02-24 | Discharge: 2019-02-24 | Disposition: A | Discharge status: Home / Self Care | Payer: No Typology Code available for payment source | Attending: Emergency Medicine | Admitting: Emergency Medicine

## 2019-02-24 ENCOUNTER — Other Ambulatory Visit: Payer: Self-pay

## 2019-02-24 ENCOUNTER — Encounter (HOSPITAL_BASED_OUTPATIENT_CLINIC_OR_DEPARTMENT_OTHER): Payer: Self-pay | Admitting: Emergency Medicine

## 2019-02-24 DIAGNOSIS — O99511 Diseases of the respiratory system complicating pregnancy, first trimester: Secondary | ICD-10-CM | POA: Diagnosis not present

## 2019-02-24 DIAGNOSIS — Z3491 Encounter for supervision of normal pregnancy, unspecified, first trimester: Secondary | ICD-10-CM

## 2019-02-24 DIAGNOSIS — R1031 Right lower quadrant pain: Secondary | ICD-10-CM | POA: Diagnosis not present

## 2019-02-24 DIAGNOSIS — N760 Acute vaginitis: Secondary | ICD-10-CM

## 2019-02-24 DIAGNOSIS — N898 Other specified noninflammatory disorders of vagina: Secondary | ICD-10-CM | POA: Insufficient documentation

## 2019-02-24 DIAGNOSIS — O9989 Other specified diseases and conditions complicating pregnancy, childbirth and the puerperium: Secondary | ICD-10-CM | POA: Insufficient documentation

## 2019-02-24 DIAGNOSIS — J45909 Unspecified asthma, uncomplicated: Secondary | ICD-10-CM | POA: Insufficient documentation

## 2019-02-24 DIAGNOSIS — B9689 Other specified bacterial agents as the cause of diseases classified elsewhere: Secondary | ICD-10-CM

## 2019-02-24 DIAGNOSIS — O23591 Infection of other part of genital tract in pregnancy, first trimester: Secondary | ICD-10-CM | POA: Diagnosis not present

## 2019-02-24 DIAGNOSIS — Z3A01 Less than 8 weeks gestation of pregnancy: Secondary | ICD-10-CM | POA: Diagnosis not present

## 2019-02-24 LAB — COMPREHENSIVE METABOLIC PANEL
ALT: 16 U/L (ref 0–44)
AST: 17 U/L (ref 15–41)
Albumin: 3.8 g/dL (ref 3.5–5.0)
Alkaline Phosphatase: 41 U/L (ref 38–126)
Anion gap: 6 (ref 5–15)
BUN: 9 mg/dL (ref 6–20)
CO2: 22 mmol/L (ref 22–32)
Calcium: 8.9 mg/dL (ref 8.9–10.3)
Chloride: 106 mmol/L (ref 98–111)
Creatinine, Ser: 0.73 mg/dL (ref 0.44–1.00)
GFR calc Af Amer: >60 mL/min (ref >60)
GFR calc non Af Amer: >60 mL/min (ref >60)
Glucose, Bld: 91 mg/dL (ref 70–99)
Potassium: 3.9 mmol/L (ref 3.5–5.1)
Sodium: 134 mmol/L — ABNORMAL LOW (ref 135–145)
Total Bilirubin: 0.5 mg/dL (ref 0.3–1.2)
Total Protein: 6.8 g/dL (ref 6.5–8.1)

## 2019-02-24 LAB — URINALYSIS, ROUTINE W REFLEX MICROSCOPIC
Bilirubin Urine: NEGATIVE
Glucose, UA: NEGATIVE mg/dL
Hgb urine dipstick: NEGATIVE
Ketones, ur: NEGATIVE mg/dL
Leukocytes,Ua: NEGATIVE
Nitrite: NEGATIVE
Protein, ur: NEGATIVE mg/dL
Specific Gravity, Urine: 1.02 (ref 1.005–1.030)
pH: 6.5 (ref 5.0–8.0)

## 2019-02-24 LAB — CBC WITH DIFFERENTIAL/PLATELET
Abs Immature Granulocytes: 0 10*3/uL (ref 0.00–0.07)
Basophils Absolute: 0 10*3/uL (ref 0.0–0.1)
Basophils Relative: 1 %
Eosinophils Absolute: 0 10*3/uL (ref 0.0–0.5)
Eosinophils Relative: 1 %
HCT: 38.1 % (ref 36.0–46.0)
Hemoglobin: 11.5 g/dL — ABNORMAL LOW (ref 12.0–15.0)
Immature Granulocytes: 0 %
Lymphocytes Relative: 38 %
Lymphs Abs: 1.4 10*3/uL (ref 0.7–4.0)
MCH: 25.2 pg — ABNORMAL LOW (ref 26.0–34.0)
MCHC: 30.2 g/dL (ref 30.0–36.0)
MCV: 83.6 fL (ref 80.0–100.0)
Monocytes Absolute: 0.4 10*3/uL (ref 0.1–1.0)
Monocytes Relative: 10 %
Neutro Abs: 1.9 10*3/uL (ref 1.7–7.7)
Neutrophils Relative %: 50 %
Platelets: 244 10*3/uL (ref 150–400)
RBC: 4.56 MIL/uL (ref 3.87–5.11)
RDW: 13.2 % (ref 11.5–15.5)
WBC: 3.7 10*3/uL — ABNORMAL LOW (ref 4.0–10.5)
nRBC: 0 % (ref 0.0–0.2)

## 2019-02-24 LAB — WET PREP, GENITAL
Sperm: NONE SEEN
Trich, Wet Prep: NONE SEEN
Yeast Wet Prep HPF POC: NONE SEEN

## 2019-02-24 LAB — HCG, QUANTITATIVE, PREGNANCY: hCG, Beta Chain, Quant, S: 1883 m[IU]/mL — ABNORMAL HIGH (ref <5)

## 2019-02-24 LAB — PREGNANCY, URINE: Preg Test, Ur: POSITIVE — AB

## 2019-02-24 MED ORDER — METRONIDAZOLE 500 MG PO TABS: 500.0000 mg | ORAL_TABLET | Freq: Two times a day (BID) | ORAL | 0 refills | Status: AC

## 2019-02-24 MED ORDER — PRENATAL COMPLETE 14-0.4 MG PO TABS: 1.0000 | ORAL_TABLET | Freq: Every day | ORAL | 0 refills | Status: AC

## 2019-02-24 MED FILL — metroNIDAZOLE 500 MG TABS: 500 | 7 days supply | Qty: 14 | Fill #0

## 2019-02-24 MED FILL — PRENATAL VITAMIN PLUS LOW I: 27-1 | 30 days supply | Qty: 30 | Fill #0

## 2019-02-24 NOTE — Discharge Instructions (Addendum)
You may take over the counter doxylamine (unisom) and vitamin B6 for pregnancy induced nausea.

## 2019-02-24 NOTE — ED Notes (Signed)
Patient transported to Ultrasound 

## 2019-02-24 NOTE — ED Provider Notes (Signed)
[facility] EMERGENCY DEPARTMENT Provider Note   CSN: [id number] Arrival date & time: 02/24/19  [id number]    History   Chief Complaint Chief Complaint  Patient presents with  . Urinary Frequency    HPI Nicole Barron is a 33 y.o. female.     HPI  4/4 began to have urinary symptoms, [facility] but [facility] did not [facility] Amoxicillin 500mg  was picked up 4/17 Still having urinary urgency, back pain, side pain. Those began around 4/17 Nausea, one episode of vomiting just this AM. Pain right lower quadrant and right flank Still having urgency, feels like incomplete emptying, dysuria just starting, hesitancy, frequency Hx of UTIs  More vaginal discharge for a few days LMP can't remember but had one last month    Past Medical History:  Diagnosis Date  . Asthma   . Fibromyalgia   . Hypotension   . UTI (urinary tract infection)     There are no active problems to display for this patient.   Past Surgical History:  Procedure Laterality Date  . ABDOMINAL SURGERY     Exploratory for chronic RLQ pain  . KNEE ARTHROSCOPY Left      OB History    Gravida  7   Para  2   Term  2   Preterm  0   AB  3   Living  2     SAB  3   TAB  0   Ectopic  0   Multiple  0   Live Births  2            Home Medications    Prior to Admission medications   Medication Sig Start Date End Date Taking? Authorizing Provider  albuterol (PROVENTIL HFA;VENTOLIN HFA) 108 (90 Base) MCG/ACT inhaler Inhale into the lungs every 6 (six) hours as needed for wheezing or shortness of breath.    [provider]  metroNIDAZOLE (FLAGYL) 500 MG tablet Take 1 tablet (500 mg total) by mouth 2 (two) times daily for 7 days. 02/24/19 03/03/19  Alvira MondaySchlossman, Mikelle Myrick, MD  Prenatal Vit-Fe Fumarate-FA (PRENATAL COMPLETE) 14-0.4 MG TABS Take 1 tablet by mouth daily. 02/24/19   Alvira MondaySchlossman, Britanie Harshman, MD    Family History No family history on file.  Social History Social History    Tobacco Use  . Smoking status: Never Smoker  . Smokeless tobacco: Never Used  Substance Use Topics  . Alcohol use: Yes    Comment: occ  . Drug use: Not Currently    Comment: last smoked 1/29     Allergies   Latex   Review of Systems Review of Systems  Constitutional: Negative for fever.  HENT: Negative for sore throat.   Eyes: Negative for visual disturbance.  Respiratory: Negative for cough and shortness of breath.   Cardiovascular: Negative for chest pain.  Gastrointestinal: Positive for abdominal pain, nausea and vomiting. Negative for constipation and diarrhea.  Genitourinary: Positive for dysuria, flank pain and vaginal discharge. Negative for difficulty urinating.  Musculoskeletal: Positive for back pain.  Skin: Negative for rash.  Neurological: Negative for syncope and headaches.     Physical Exam Updated Vital Signs BP 124/72 (BP Location: Right Arm)   Pulse 72   Temp 98.4 F (36.9 C) (Oral)   Resp 12   Ht 5\' 5"  (1.651 m)   Wt 65.8 kg   LMP  (LMP Unknown) Comment: unsure, but did have one in March 2020  SpO2 100%   BMI 24.13  kg/m   Physical Exam Vitals signs and nursing note reviewed.  Constitutional:      General: She is not in acute distress.    Appearance: She is well-developed. She is not diaphoretic.  HENT:     Head: Normocephalic and atraumatic.  Eyes:     Conjunctiva/sclera: Conjunctivae normal.  Neck:     Musculoskeletal: Normal range of motion.  Cardiovascular:     Rate and Rhythm: Normal rate and regular rhythm.     Heart sounds: Normal heart sounds. No murmur. No friction rub. No gallop.   Pulmonary:     Effort: Pulmonary effort is normal. No respiratory distress.     Breath sounds: Normal breath sounds. No wheezing or rales.  Abdominal:     General: There is no distension.     Palpations: Abdomen is soft.     Tenderness: There is abdominal tenderness in the right lower quadrant. There is no guarding.  Genitourinary:    Cervix:  Discharge present. No cervical motion tenderness or cervical bleeding.     Uterus: Not tender.      Adnexa:        Right: Tenderness present.        Left: No tenderness or fullness.    Musculoskeletal:        General: No tenderness.  Skin:    General: Skin is warm and dry.     Findings: No erythema or rash.  Neurological:     Mental Status: She is alert and oriented to person, place, and time.      ED Treatments / Results  Labs (all labs ordered are listed, but only abnormal results are displayed) Labs Reviewed  WET PREP, GENITAL - Abnormal; Notable for the following components:      Result Value   Clue Cells Wet Prep HPF POC PRESENT (*)    WBC, Wet Prep HPF POC MANY (*)    All other components within normal limits  PREGNANCY, URINE - Abnormal; Notable for the following components:   Preg Test, Ur POSITIVE (*)    All other components within normal limits  HCG, QUANTITATIVE, PREGNANCY - Abnormal; Notable for the following components:   hCG, Beta Chain, Quant, S 1,883 (*)    All other components within normal limits  CBC WITH DIFFERENTIAL/PLATELET - Abnormal; Notable for the following components:   WBC 3.7 (*)    Hemoglobin 11.5 (*)    MCH 25.2 (*)    All other components within normal limits  COMPREHENSIVE METABOLIC PANEL - Abnormal; Notable for the following components:   Sodium 134 (*)    All other components within normal limits  URINALYSIS, ROUTINE W REFLEX MICROSCOPIC  GC/CHLAMYDIA PROBE AMP ([place]) NOT AT [facility]    EKG None  Radiology US Ob Less Than 14 Weeks With Ob Transvaginal  Result Date: 02/24/2019 CLINICAL DATA:  Right lower quadrant pain EXAM: OBSTETRIC <14 WK [place] AND TRANSVAGINAL OB US TECHNIQUE: Both transabdominal and transvaginal ultrasound examinations were performed for complete evaluation of the gestation as well as the maternal uterus, adnexal regions, and pelvic cul-de-sac. Transvaginal technique was performed to assess early pregnancy.  COMPARISON:  None. FINDINGS: Intrauterine gestational sac: Single Yolk sac:  Visualized. Embryo:  Not Visualized. Cardiac Activity: Not Visualized. MSD: 3.2  mm   5 w   0  d Subchorionic hemorrhage:  None visualized. Maternal uterus/adnexae: 3.4 x 2.9 x 3.8 cm hypoechoic avascular complex cystic left ovarian mass with multiple thin septations may reflect a hemorrhagic cyst.  Normal right ovary. 1.3 x 1 x 1.3 cm hypoechoic posterior uterine mass likely reflecting a submucosal leiomyoma. IMPRESSION: 1. Probable early intrauterine gestational sac, but no yolk sac, fetal pole, or cardiac activity yet visualized. Recommend follow-up quantitative B-HCG levels and follow-up US in 14 days to assess viability. This recommendation follows SRU consensus guidelines: Diagnostic Criteria for Nonviable Pregnancy Early in the First Trimester. Malva Limes Med 2013; 021:1155-20. 2. 3.4 x 2.9 x 3.8 cm complex cystic left ovarian mass without internal Doppler flow likely reflecting a hemorrhagic cyst. This can be re-evaluated the same time as impression 1. Electronically Signed   By: Elige Ko   On: 02/24/2019 11:22    Procedures Procedures (including critical care time)  Medications Ordered in ED Medications - No data to display   Initial Impression / Assessment and Plan / ED Course  I have reviewed the triage vital signs and the nursing notes.  Pertinent labs & imaging results that were available during my care of the patient were reviewed by me and considered in my medical decision making (see chart for details).        33yo female presents with concern for right abdominal and flank pain, urinary frequency and hesitancy.    UA shows no evidence of infection.  Pregnancy test positive  Given abdominal pain, will check hcg quant/labs/pelvic exam/TVUS.   Hcg [id number].  Will treat for symptomatic BV with flagyl.  TVUS shows likely gestation sac with no yolk sac.  Discussed possibility of early intrauterine  pregnancy versus possible ectopic pregnancy.  Recommend 48hr follow up hcg, strict return precautions.  Patient discharged in stable condition with understanding of reasons to return.   Final Clinical Impressions(s) / ED Diagnoses   Final diagnoses:  First trimester pregnancy  Bacterial vaginosis  Right lower quadrant abdominal pain    ED Discharge Orders         Ordered    metroNIDAZOLE (FLAGYL) 500 MG tablet  2 times daily     02/24/19 1131    Prenatal Vit-Fe Fumarate-FA (PRENATAL COMPLETE) 14-0.4 MG TABS  Daily     02/24/19 1131           [name], MD 02/24/19 2112

## 2019-02-24 NOTE — ED Triage Notes (Signed)
Urinary frequency and dysuria.  Sts [facility] but there was a delay in shipping so she didn't get it until the 17th. Amox 500mg .  She is not sure if it is working or not.  She is "tired of it" and wants "to be sure".

## 2019-02-24 NOTE — ED Notes (Signed)
ED Provider at bedside discussing test results and dispo plan of care. 

## 2019-02-25 LAB — GC/CHLAMYDIA PROBE AMP (~~LOC~~) NOT AT ARMC
Chlamydia: NEGATIVE
Neisseria Gonorrhea: NEGATIVE

## 2019-03-28 ENCOUNTER — Other Ambulatory Visit: Payer: Self-pay

## 2019-03-28 ENCOUNTER — Emergency Department (HOSPITAL_BASED_OUTPATIENT_CLINIC_OR_DEPARTMENT_OTHER): Payer: No Typology Code available for payment source

## 2019-03-28 ENCOUNTER — Emergency Department (HOSPITAL_BASED_OUTPATIENT_CLINIC_OR_DEPARTMENT_OTHER)
Admission: EM | Admit: 2019-03-28 | Discharge: 2019-03-28 | Disposition: A | Discharge status: Home / Self Care | Payer: No Typology Code available for payment source | Attending: Emergency Medicine | Admitting: Emergency Medicine

## 2019-03-28 ENCOUNTER — Encounter (HOSPITAL_BASED_OUTPATIENT_CLINIC_OR_DEPARTMENT_OTHER): Payer: Self-pay | Admitting: *Deleted

## 2019-03-28 DIAGNOSIS — O219 Vomiting of pregnancy, unspecified: Secondary | ICD-10-CM

## 2019-03-28 DIAGNOSIS — Z3A22 22 weeks gestation of pregnancy: Secondary | ICD-10-CM | POA: Insufficient documentation

## 2019-03-28 DIAGNOSIS — O200 Threatened abortion: Secondary | ICD-10-CM | POA: Insufficient documentation

## 2019-03-28 DIAGNOSIS — R112 Nausea with vomiting, unspecified: Secondary | ICD-10-CM | POA: Diagnosis not present

## 2019-03-28 DIAGNOSIS — O26859 Spotting complicating pregnancy, unspecified trimester: Secondary | ICD-10-CM | POA: Diagnosis present

## 2019-03-28 HISTORY — DX: Mild hyperemesis gravidarum: O21.0

## 2019-03-28 LAB — BASIC METABOLIC PANEL
Anion gap: 9 (ref 5–15)
BUN: 5 mg/dL — ABNORMAL LOW (ref 6–20)
CO2: 21 mmol/L — ABNORMAL LOW (ref 22–32)
Calcium: 9.6 mg/dL (ref 8.9–10.3)
Chloride: 103 mmol/L (ref 98–111)
Creatinine, Ser: 0.66 mg/dL (ref 0.44–1.00)
GFR calc Af Amer: >60 mL/min (ref >60)
GFR calc non Af Amer: >60 mL/min (ref >60)
Glucose, Bld: 93 mg/dL (ref 70–99)
Potassium: 3.9 mmol/L (ref 3.5–5.1)
Sodium: 133 mmol/L — ABNORMAL LOW (ref 135–145)

## 2019-03-28 LAB — CBC
HCT: 42 % (ref 36.0–46.0)
Hemoglobin: 12.8 g/dL (ref 12.0–15.0)
MCH: 25.1 pg — ABNORMAL LOW (ref 26.0–34.0)
MCHC: 30.5 g/dL (ref 30.0–36.0)
MCV: 82.5 fL (ref 80.0–100.0)
Platelets: 298 10*3/uL (ref 150–400)
RBC: 5.09 MIL/uL (ref 3.87–5.11)
RDW: 13.2 % (ref 11.5–15.5)
WBC: 5.7 10*3/uL (ref 4.0–10.5)
nRBC: 0 % (ref 0.0–0.2)

## 2019-03-28 MED ORDER — SODIUM CHLORIDE 0.9 % IV BOLUS
1000.0000 mL | Freq: Once | INTRAVENOUS | Status: AC
  Administered 2019-03-28: 19:00:00 1000 mL via INTRAVENOUS

## 2019-03-28 NOTE — Discharge Instructions (Signed)
Please follow-up with your OB/GYN.  Your ultrasound and labs today were reassuring.

## 2019-03-28 NOTE — ED Provider Notes (Signed)
[facility] EMERGENCY DEPARTMENT Provider Note   CSN: [id number] Arrival date & time: 03/28/19  1837    History   Chief Complaint Chief Complaint  Patient presents with  . Vaginal Bleeding    9 wk preg    HPI [place] is a 33 y.o. female [redacted]w[redacted]d G6P3 2122 who presents today for evaluation of vaginal bleeding.  She reports that on 5/20 she had intercourse and since then she has had vaginal bleeding.  She was seen by her OB on 5/22 for this, they recommended pelvic rest and provided reassurance.  She reports that since then the bleeding has increased.  She is saturating 3-4 pads a day.  She reports that she attempted to contact her OB/GYN and left a message however had not heard back from them yet.  She also reports that she has been continuously struggling with nausea and vomiting during this pregnancy.  She states that she has vomited multiple times today, and while that is not abnormal for her this pregnancy, she is concerned she may be dehydrated.     HPI  Past Medical History:  Diagnosis Date  . Asthma   . Fibromyalgia   . Hyperemesis gravidarum   . Hypotension   . UTI (urinary tract infection)     There are no active problems to display for this patient.   Past Surgical History:  Procedure Laterality Date  . ABDOMINAL SURGERY     Exploratory for chronic RLQ pain  . KNEE ARTHROSCOPY Left      OB History    Gravida  7   Para  2   Term  2   Preterm  0   AB  3   Living  2     SAB  3   TAB  0   Ectopic  0   Multiple  0   Live Births  2            Home Medications    Prior to Admission medications   Medication Sig Start Date End Date Taking? Authorizing Provider  albuterol (PROVENTIL HFA;VENTOLIN HFA) 108 (90 Base) MCG/ACT inhaler Inhale into the lungs every 6 (six) hours as needed for wheezing or shortness of breath.   Yes [provider]  doxylamine, Sleep, (UNISOM) 25 MG tablet Take by mouth. 03/10/19 04/09/19 Yes  [provider]  Prenatal Vit-Fe Fumarate-FA (PRENATAL COMPLETE) 14-0.4 MG TABS Take 1 tablet by mouth daily. 02/24/19  Yes [name], MD  progesterone (PROMETRIUM) 200 MG capsule Place vaginally. 02/26/19  Yes [provider]    Family History No family history on file.  Social History Social History   Tobacco Use  . Smoking status: Never Smoker  . Smokeless tobacco: Never Used  Substance Use Topics  . Alcohol use: Not Currently    Comment: occ  . Drug use: Not Currently    Comment: last smoked 1/29     Allergies   Butalbital-apap-caffeine and Latex   Review of Systems Review of Systems  Constitutional: Negative for chills and fever.  Respiratory: Negative for chest tightness and shortness of breath.   Gastrointestinal: Positive for nausea and vomiting. Negative for abdominal pain and diarrhea.  Genitourinary: Positive for vaginal bleeding. Negative for decreased urine volume, dysuria, pelvic pain, urgency, vaginal discharge and vaginal pain.  Musculoskeletal: Negative for back pain and neck pain.  Neurological: Negative for light-headedness.  All other systems reviewed and are negative.    Physical Exam Updated Vital Signs BP  113/63 (BP Location: Right Arm)   Pulse 80   Temp 99.6 F (37.6 C) (Oral)   Resp 18   Ht 5\' 4"  (1.626 m)   Wt 64.9 kg   LMP 01/22/2019 Comment: unsure, but did have one in March 2020  SpO2 100%   BMI 24.55 kg/m   Physical Exam Vitals signs and nursing note reviewed. Exam conducted with a chaperone present (Female RN).  Constitutional:      General: She is not in acute distress.    Appearance: She is well-developed. She is not diaphoretic.  HENT:     Head: Normocephalic and atraumatic.  Eyes:     General: No scleral icterus.       Right eye: No discharge.        Left eye: No discharge.     Conjunctiva/sclera: Conjunctivae normal.  Neck:     Musculoskeletal: Normal range of motion.  Cardiovascular:      Rate and Rhythm: Normal rate and regular rhythm.  Pulmonary:     Effort: Pulmonary effort is normal. No respiratory distress.     Breath sounds: No stridor.  Abdominal:     General: There is no distension.  Genitourinary:    General: Normal vulva.     Vagina: No vaginal discharge.     Cervix: Cervical bleeding present. No cervical motion tenderness.     Adnexa: Right adnexa normal and left adnexa normal.     Comments: There is scant blood coming from the cervix.  Os is closed.  Musculoskeletal:        General: No deformity.  Skin:    General: Skin is warm and dry.  Neurological:     General: No focal deficit present.     Mental Status: She is alert and oriented to person, place, and time.     Motor: No abnormal muscle tone.  Psychiatric:        Mood and Affect: Mood normal.        Behavior: Behavior normal.      ED Treatments / Results  Labs (all labs ordered are listed, but only abnormal results are displayed) Labs Reviewed  CBC - Abnormal; Notable for the following components:      Result Value   MCH 25.1 (*)    All other components within normal limits  BASIC METABOLIC PANEL - Abnormal; Notable for the following components:   Sodium 133 (*)    CO2 21 (*)    BUN 5 (*)    All other components within normal limits    EKG None  Radiology [place] Ob Comp < 14 Wks  Result Date: 03/28/2019 CLINICAL DATA:  Vaginal bleeding for 4 days after having intercourse EXAM: OBSTETRIC <14 WK US AND TRANSVAGINAL OB US TECHNIQUE: Both transabdominal and transvaginal ultrasound examinations were performed for complete evaluation of the gestation as well as the maternal uterus, adnexal regions, and pelvic cul-de-sac. Transvaginal technique was performed to assess early pregnancy. COMPARISON:  None. FINDINGS: Intrauterine gestational sac: Single Yolk sac:  Visualized. Embryo:  Visualized. Cardiac Activity: Visualized. Heart Rate: 173 bpm CRL:  26.1 mm   9 w   3 d                  US EDC:  10/28/2019 Subchorionic hemorrhage:  None visualized. Maternal uterus/adnexae: Normal right ovary. 3.8 x 3.4 x 3.5 cm complex cystic left ovarian mass without internal Doppler flow and multiple thin internal septations likely reflecting a hemorrhagic cyst. Trace pelvic free fluid.  IMPRESSION: 1. Single live intrauterine pregnancy as detailed above. 2. 3.8 x 3.4 x 3.5 cm complex cystic left ovarian mass likely reflecting a hemorrhagic cyst. Electronically Signed   By: Elige Ko   On: 03/28/2019 20:55   US Ob Transvaginal  Result Date: 03/28/2019 CLINICAL DATA:  Vaginal bleeding for 4 days after having intercourse EXAM: OBSTETRIC <14 WK [place] AND TRANSVAGINAL OB US TECHNIQUE: Both transabdominal and transvaginal ultrasound examinations were performed for complete evaluation of the gestation as well as the maternal uterus, adnexal regions, and pelvic cul-de-sac. Transvaginal technique was performed to assess early pregnancy. COMPARISON:  None. FINDINGS: Intrauterine gestational sac: Single Yolk sac:  Visualized. Embryo:  Visualized. Cardiac Activity: Visualized. Heart Rate: 173 bpm CRL:  26.1 mm   9 w   3 d                  [place] EDC: 10/28/2019 Subchorionic hemorrhage:  None visualized. Maternal uterus/adnexae: Normal right ovary. 3.8 x 3.4 x 3.5 cm complex cystic left ovarian mass without internal Doppler flow and multiple thin internal septations likely reflecting a hemorrhagic cyst. Trace pelvic free fluid. IMPRESSION: 1. Single live intrauterine pregnancy as detailed above. 2. 3.8 x 3.4 x 3.5 cm complex cystic left ovarian mass likely reflecting a hemorrhagic cyst. Electronically Signed   By: Elige Ko   On: 03/28/2019 20:55    Procedures Procedures (including critical care time)  Medications Ordered in ED Medications  sodium chloride 0.9 % bolus 1,000 mL ( Intravenous Stopped 03/28/19 2021)     Initial Impression / Assessment and Plan / ED Course  I have reviewed the triage vital signs and the  nursing notes.  Pertinent labs & imaging results that were available during my care of the patient were reviewed by me and considered in my medical decision making (see chart for details).  Clinical Course as of Mar 27 2124  Wynelle Link Mar 28, 2019  2119 Patient reevaluated, we discussed ultrasound and lab results.  She reports that she feels much better after getting fluids.    [EH]    Clinical Course User Index [EH] Cristina Gong, PA-C      Patient presents today for evaluation of vaginal bleeding and pregnancy.  She is G6, P3, 9 weeks 2 days.  Her bleeding started after she had intercourse and she has been seen by her OB/GYN however since then over the past 2 days the bleeding has worsened.  Pelvic exam performed showing small amount of blood that appeared to be coming from the cervix which was closed.  Labs were obtained and reviewed, she is slightly dehydrated with elevated BUN, however does not have any other significant hematologic or electrolyte derangements.  She is not tachycardic or hypotensive.  She was treated with 1 L IV fluids while in the emergency room due to her reported multiple episodes of nausea and vomiting after which she stated that she felt much better overall.  She does not wish for different medications at this time, rather wanted fluids while here.  Ultrasound was obtained showing single intrauterine gestation with reassuring cardiac activity and no subchorionic hemorrhage.  We discussed follow-up with OB/GYN.  Chart review from records from her OB/GYN visit within the past week states that her blood type is B+, therefore not a RhoGam candidate.  Recommended pelvic rest.  Recommended that if she needs additional emergency medical care for a pregnancy related issue that she go to [facility] maternity assessment unit.  Return precautions were  discussed with patient who states their understanding.  At the time of discharge patient denied any unaddressed  complaints or concerns.  Patient is agreeable for discharge home.   Final Clinical Impressions(s) / ED Diagnoses   Final diagnoses:  Threatened miscarriage  Nausea and vomiting during pregnancy prior to [redacted] weeks gestation    ED Discharge Orders    None       K[place] Clay 03/28/19 2125    Rolan Bucco, MD 03/28/19 2237

## 2019-03-28 NOTE — ED Notes (Signed)
Patient transported to US 

## 2019-03-28 NOTE — ED Notes (Signed)
ED Provider at bedside. 

## 2019-03-28 NOTE — ED Triage Notes (Signed)
Pt reports she is [redacted] weeks pregnant. She states she began having vaginal bleeding after having sex on 5/20 and it has not stopped

## 2020-05-26 ENCOUNTER — Emergency Department (HOSPITAL_BASED_OUTPATIENT_CLINIC_OR_DEPARTMENT_OTHER)
Admission: EM | Admit: 2020-05-26 | Discharge: 2020-05-26 | Discharge status: Absent without leave | Payer: No Typology Code available for payment source

## 2020-05-26 ENCOUNTER — Other Ambulatory Visit: Payer: Self-pay

## 2023-10-13 ENCOUNTER — Emergency Department (HOSPITAL_BASED_OUTPATIENT_CLINIC_OR_DEPARTMENT_OTHER): Payer: No Typology Code available for payment source

## 2023-10-13 ENCOUNTER — Emergency Department (HOSPITAL_BASED_OUTPATIENT_CLINIC_OR_DEPARTMENT_OTHER)
Admission: EM | Admit: 2023-10-13 | Discharge: 2023-10-13 | Disposition: A | Discharge status: Home / Self Care | Payer: No Typology Code available for payment source | Attending: Emergency Medicine | Admitting: Emergency Medicine

## 2023-10-13 ENCOUNTER — Other Ambulatory Visit: Payer: Self-pay

## 2023-10-13 ENCOUNTER — Encounter (HOSPITAL_BASED_OUTPATIENT_CLINIC_OR_DEPARTMENT_OTHER): Payer: Self-pay | Admitting: Emergency Medicine

## 2023-10-13 DIAGNOSIS — M25512 Pain in left shoulder: Secondary | ICD-10-CM | POA: Diagnosis not present

## 2023-10-13 DIAGNOSIS — M25532 Pain in left wrist: Secondary | ICD-10-CM | POA: Diagnosis not present

## 2023-10-13 DIAGNOSIS — M542 Cervicalgia: Secondary | ICD-10-CM | POA: Diagnosis present

## 2023-10-13 DIAGNOSIS — M25562 Pain in left knee: Secondary | ICD-10-CM | POA: Diagnosis not present

## 2023-10-13 DIAGNOSIS — Y92410 Unspecified street and highway as the place of occurrence of the external cause: Secondary | ICD-10-CM | POA: Insufficient documentation

## 2023-10-13 DIAGNOSIS — Z91040 Latex allergy status: Secondary | ICD-10-CM | POA: Insufficient documentation
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
## 2023-10-13 NOTE — ED Notes (Signed)
Pt left after speaking with provider. Pt did not get AVS paperwork.
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
## 2023-12-31 ENCOUNTER — Emergency Department (HOSPITAL_BASED_OUTPATIENT_CLINIC_OR_DEPARTMENT_OTHER): Payer: No Typology Code available for payment source

## 2023-12-31 ENCOUNTER — Other Ambulatory Visit: Payer: Self-pay

## 2023-12-31 ENCOUNTER — Emergency Department (HOSPITAL_BASED_OUTPATIENT_CLINIC_OR_DEPARTMENT_OTHER)
Admission: EM | Admit: 2023-12-31 | Discharge: 2023-12-31 | Disposition: A | Discharge status: Home / Self Care | Payer: No Typology Code available for payment source | Attending: Emergency Medicine | Admitting: Emergency Medicine

## 2023-12-31 ENCOUNTER — Encounter (HOSPITAL_BASED_OUTPATIENT_CLINIC_OR_DEPARTMENT_OTHER): Payer: Self-pay | Admitting: Emergency Medicine

## 2023-12-31 DIAGNOSIS — Z91040 Latex allergy status: Secondary | ICD-10-CM | POA: Insufficient documentation

## 2023-12-31 DIAGNOSIS — J101 Influenza due to other identified influenza virus with other respiratory manifestations: Secondary | ICD-10-CM | POA: Diagnosis not present

## 2023-12-31 DIAGNOSIS — R059 Cough, unspecified: Secondary | ICD-10-CM | POA: Diagnosis present

## 2023-12-31 LAB — RESP PANEL BY RT-PCR (RSV, FLU A&B, COVID)  RVPGX2
Influenza A by PCR: POSITIVE — AB
Influenza B by PCR: NEGATIVE
Resp Syncytial Virus by PCR: NEGATIVE
SARS Coronavirus 2 by RT PCR: NEGATIVE

## 2023-12-31 NOTE — ED Provider Notes (Signed)
 [place] EMERGENCY DEPARTMENT AT [facility] Provider Note   CSN: [id number] Arrival date & time: 12/31/23  1009     History  Chief Complaint  Patient presents with   Cough    Nicole Barron is a 38 y.o. female who presents with cough, congestion and bodyaches x 4 days.  Patient denies significant abdominal pain vomiting or diarrhea.  No significant chest pain or shortness of breath reported.   Cough      Home Medications Prior to Admission medications   Medication Sig Start Date End Date Taking? Authorizing Provider  albuterol (PROVENTIL HFA;VENTOLIN HFA) 108 (90 Base) MCG/ACT inhaler Inhale into the lungs every 6 (six) hours as needed for wheezing or shortness of breath.    [provider]  doxylamine, Sleep, (UNISOM) 25 MG tablet Take by mouth. 03/10/19 04/09/19  [provider]  Prenatal Vit-Fe Fumarate-FA (PRENATAL COMPLETE) 14-0.4 MG TABS Take 1 tablet by mouth daily. 02/24/19   [name], MD  progesterone (PROMETRIUM) 200 MG capsule Place vaginally. 02/26/19   [provider]      Allergies    Butalbital-apap-caffeine and Latex    Review of Systems   Review of Systems  Respiratory:  Positive for cough.     Physical Exam Updated Vital Signs BP (!) 112/93 (BP Location: Left Arm)   Pulse (!) 115   Temp 98.4 F (36.9 C) (Oral)   Resp 16   Ht 5\' 4"  (1.626 m)   SpO2 100%   BMI 31.41 kg/m  Physical Exam Vitals and nursing note reviewed.  Constitutional:      General: She is not in acute distress.    Appearance: She is well-developed.  HENT:     Head: Normocephalic and atraumatic.     Mouth/Throat:     Pharynx: Oropharyngeal exudate present. No posterior oropharyngeal erythema.  Eyes:     Conjunctiva/sclera: Conjunctivae normal.  Cardiovascular:     Rate and Rhythm: Normal rate and regular rhythm.     Heart sounds: No murmur heard. Pulmonary:     Effort: Pulmonary effort is normal. No respiratory distress.      Breath sounds: Normal breath sounds. No wheezing or rales.  Abdominal:     Palpations: Abdomen is soft.     Tenderness: There is no abdominal tenderness. There is no guarding or rebound.  Musculoskeletal:        General: No swelling.     Cervical back: Neck supple.  Skin:    General: Skin is warm and dry.     Capillary Refill: Capillary refill takes less than 2 seconds.  Neurological:     Mental Status: She is alert.  Psychiatric:        Mood and Affect: Mood normal.     ED Results / Procedures / Treatments   Labs (all labs ordered are listed, but only abnormal results are displayed) Labs Reviewed  RESP PANEL BY RT-PCR (RSV, FLU A&B, COVID)  RVPGX2 - Abnormal; Notable for the following components:      Result Value   Influenza A by PCR POSITIVE (*)    All other components within normal limits    EKG None  Radiology DG Chest 2 View Result Date: 12/31/2023 CLINICAL DATA:  cough EXAM: CHEST - 2 VIEW COMPARISON:  11/04/2017 FINDINGS: The heart size and mediastinal contours are within normal limits. Both lungs are clear. Minor left basilar atelectasis versus scarring. Trachea midline. No effusion or pneumothorax. The visualized skeletal structures are unremarkable. IMPRESSION:  Minor left basilar atelectasis versus scarring. Otherwise no acute finding by plain radiography. Electronically Signed   By: Judie Petit.  Shick M.D.   On: 12/31/2023 11:34    Procedures Procedures    Medications Ordered in ED Medications - No data to display  ED Course/ Medical Decision Making/ A&P                                 Medical Decision Making Amount and/or Complexity of Data Reviewed Radiology: ordered.   This patient presents to the ED with chief complaint(s) of flulike symptoms.  The complaint involves an extensive differential diagnosis and also carries with it a high risk of complications and morbidity.   pertinent past medical history as listed in HPI  The differential diagnosis includes   viral illness, pharyngitis, mono, sinusitis,AOM, pneumonia  The initial plan is to  Obtain respiratory panel Additional history obtained: EMR reviewed  Initial Assessment:   Nontoxic-appearing patient presenting with URI symptoms.  I have a low suspicion for pneumonia as lung sounds are clear and patient is afebrile.  No exudate or significant cervical lymphadenopathy on exam to suggest strep or mono.  Overall history and exam are most suspicious for viral URI.  Patient is hemodynamically stable and not requiring oxygen.  Anticipate discharge home with supportive care.  Independent ECG interpretation:  None   Independent labs interpretation:  The following labs were independently interpreted:  Aspiratory positive for influenza  Independent visualization and interpretation of imaging: I independently visualized the following imaging with scope of interpretation limited to determining acute life threatening conditions related to emergency care: Chest x-ray, which revealed no acute abnormality  Treatment and Reassessment: No medications administered during visit    Consultations obtained:   None   Disposition:   Patient will be discharged home.  Educated on supportive care.  He is outside the timeline for Tamiflu encouraged to follow-up with primary care provider should her symptoms persist. The patient has been appropriately medically screened and/or stabilized in the ED. I have low suspicion for any other emergent medical condition which would require further screening, evaluation or treatment in the ED or require inpatient management. At time of discharge the patient is hemodynamically stable and in no acute distress. I have discussed work-up results and diagnosis with patient and answered all questions. Patient is agreeable with discharge plan. We discussed strict return precautions for returning to the emergency department and they verbalized understanding.     Social Determinants  of Health:   none  This note was dictated with voice recognition software.  Despite best efforts at proofreading, errors may have occurred which can change the documentation meaning. \        Final Clinical Impression(s) / ED Diagnoses Final diagnoses:  Influenza A    Rx / DC Orders ED Discharge Orders     None         Halford Decamp, PA-C 12/31/23 1311    Arby Barrette, MD 01/05/24 [phone]

## 2023-12-31 NOTE — Discharge Instructions (Signed)
 You were evaluated in the emergency room for cough, congestion and bodyaches.  You tested positive for influenza A.  This is a virus as anticipated to resolve without antibiotics..You may use Tylenol 1000 mg and/or Motrin 600 mg every 4-6 hours up to 3 times a day for fever or body aches.  Please keep in mind that this dosing is not meant to be continued long-term and that many over-the-counter cough and flu medications contain acetaminophen or ibuprofen. You can expect your current symptoms to linger over the next week or two but please return to the emergency room if you experience any new or worsening symptoms including persistent fevers, worsening productive cough and persistent vomiting. Please follow-up with your primary care provider regarding your ER visit.  It is your responsibility to obtain any results from your visit and review any findings with your primary care doctor.

## 2023-12-31 NOTE — ED Triage Notes (Signed)
 Cough feeling bad since sat has a had a fever

## 2024-03-11 ENCOUNTER — Encounter (HOSPITAL_BASED_OUTPATIENT_CLINIC_OR_DEPARTMENT_OTHER): Payer: Self-pay

## 2024-03-11 ENCOUNTER — Emergency Department (HOSPITAL_BASED_OUTPATIENT_CLINIC_OR_DEPARTMENT_OTHER)
Admission: EM | Admit: 2024-03-11 | Discharge: 2024-03-11 | Disposition: A | Discharge status: Home / Self Care | Attending: Emergency Medicine | Admitting: Emergency Medicine

## 2024-03-11 ENCOUNTER — Other Ambulatory Visit (HOSPITAL_BASED_OUTPATIENT_CLINIC_OR_DEPARTMENT_OTHER): Payer: Self-pay

## 2024-03-11 ENCOUNTER — Other Ambulatory Visit: Payer: Self-pay

## 2024-03-11 DIAGNOSIS — N760 Acute vaginitis: Secondary | ICD-10-CM | POA: Diagnosis not present

## 2024-03-11 DIAGNOSIS — Z202 Contact with and (suspected) exposure to infections with a predominantly sexual mode of transmission: Secondary | ICD-10-CM | POA: Insufficient documentation

## 2024-03-11 DIAGNOSIS — Z91040 Latex allergy status: Secondary | ICD-10-CM | POA: Insufficient documentation

## 2024-03-11 DIAGNOSIS — R102 Pelvic and perineal pain: Secondary | ICD-10-CM | POA: Diagnosis present

## 2024-03-11 DIAGNOSIS — B3731 Acute candidiasis of vulva and vagina: Secondary | ICD-10-CM | POA: Diagnosis not present

## 2024-03-11 DIAGNOSIS — B379 Candidiasis, unspecified: Secondary | ICD-10-CM

## 2024-03-11 LAB — URINALYSIS, ROUTINE W REFLEX MICROSCOPIC
Bilirubin Urine: NEGATIVE
Glucose, UA: NEGATIVE mg/dL
Hgb urine dipstick: NEGATIVE
Ketones, ur: NEGATIVE mg/dL
Leukocytes,Ua: NEGATIVE
Nitrite: NEGATIVE
Protein, ur: NEGATIVE mg/dL
Specific Gravity, Urine: 1.015 (ref 1.005–1.030)
pH: 6.5 (ref 5.0–8.0)

## 2024-03-11 LAB — PREGNANCY, URINE: Preg Test, Ur: NEGATIVE

## 2024-03-11 LAB — HEPATITIS PANEL, ACUTE
HCV Ab: NONREACTIVE
Hep A IgM: NONREACTIVE
Hep B C IgM: NONREACTIVE
Hepatitis B Surface Ag: NONREACTIVE

## 2024-03-11 LAB — WET PREP, GENITAL
Sperm: NONE SEEN
Trich, Wet Prep: NONE SEEN
WBC, Wet Prep HPF POC: <10 (ref <10)

## 2024-03-11 LAB — HIV ANTIBODY (ROUTINE TESTING W REFLEX): HIV Screen 4th Generation wRfx: NONREACTIVE

## 2024-03-11 MED ORDER — FLUCONAZOLE 150 MG PO TABS
150.0000 mg | ORAL_TABLET | Freq: Every day | ORAL | 0 refills | Status: AC
  Filled 2024-03-11: qty 1, 1d supply, fill #0

## 2024-03-11 MED ORDER — METRONIDAZOLE 500 MG PO TABS
500.0000 mg | ORAL_TABLET | Freq: Two times a day (BID) | ORAL | 0 refills | Status: AC
  Filled 2024-03-11: qty 14, 7d supply, fill #0
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
## 2024-03-11 NOTE — Discharge Instructions (Signed)
 Follow-up your STD testing on my MyChart.  As we discussed please try to find some of the talk about the things that you have been through.
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
## 2024-03-12 LAB — GC/CHLAMYDIA PROBE AMP (~~LOC~~) NOT AT ARMC
Chlamydia: NEGATIVE
Comment: NEGATIVE
Comment: NORMAL
Neisseria Gonorrhea: NEGATIVE

## 2024-03-12 LAB — RPR: RPR Ser Ql: NONREACTIVE

## 2024-12-03 ENCOUNTER — Encounter (HOSPITAL_BASED_OUTPATIENT_CLINIC_OR_DEPARTMENT_OTHER): Payer: Self-pay | Admitting: Emergency Medicine

## 2024-12-03 ENCOUNTER — Other Ambulatory Visit: Payer: Self-pay

## 2024-12-03 ENCOUNTER — Emergency Department (HOSPITAL_BASED_OUTPATIENT_CLINIC_OR_DEPARTMENT_OTHER)
Admission: EM | Admit: 2024-12-03 | Discharge: 2024-12-03 | Disposition: A | Discharge status: Home / Self Care | Attending: Emergency Medicine | Admitting: Emergency Medicine

## 2024-12-03 ENCOUNTER — Emergency Department (HOSPITAL_BASED_OUTPATIENT_CLINIC_OR_DEPARTMENT_OTHER)

## 2024-12-03 DIAGNOSIS — Z7951 Long term (current) use of inhaled steroids: Secondary | ICD-10-CM | POA: Insufficient documentation

## 2024-12-03 DIAGNOSIS — R0789 Other chest pain: Secondary | ICD-10-CM

## 2024-12-03 DIAGNOSIS — J069 Acute upper respiratory infection, unspecified: Secondary | ICD-10-CM | POA: Insufficient documentation

## 2024-12-03 DIAGNOSIS — R0981 Nasal congestion: Secondary | ICD-10-CM

## 2024-12-03 DIAGNOSIS — J45909 Unspecified asthma, uncomplicated: Secondary | ICD-10-CM | POA: Insufficient documentation

## 2024-12-03 LAB — CBC WITH DIFFERENTIAL/PLATELET
Abs Immature Granulocytes: 0.01 10*3/uL (ref 0.00–0.07)
Basophils Absolute: 0 10*3/uL (ref 0.0–0.1)
Basophils Relative: 0 %
Eosinophils Absolute: 0.1 10*3/uL (ref 0.0–0.5)
Eosinophils Relative: 1 %
HCT: 40.4 % (ref 36.0–46.0)
Hemoglobin: 12.6 g/dL (ref 12.0–15.0)
Immature Granulocytes: 0 %
Lymphocytes Relative: 38 %
Lymphs Abs: 2 10*3/uL (ref 0.7–4.0)
MCH: 25.7 pg — ABNORMAL LOW (ref 26.0–34.0)
MCHC: 31.2 g/dL (ref 30.0–36.0)
MCV: 82.3 fL (ref 80.0–100.0)
Monocytes Absolute: 0.4 10*3/uL (ref 0.1–1.0)
Monocytes Relative: 8 %
Neutro Abs: 2.6 10*3/uL (ref 1.7–7.7)
Neutrophils Relative %: 53 %
Platelets: 321 10*3/uL (ref 150–400)
RBC: 4.91 MIL/uL (ref 3.87–5.11)
RDW: 14.2 % (ref 11.5–15.5)
WBC: 5.1 10*3/uL (ref 4.0–10.5)
nRBC: 0 % (ref 0.0–0.2)

## 2024-12-03 LAB — TROPONIN T, HIGH SENSITIVITY: Troponin T High Sensitivity: <6 ng/L (ref 0–19)

## 2024-12-03 LAB — BASIC METABOLIC PANEL WITH GFR
Anion gap: 13 (ref 5–15)
BUN: 11 mg/dL (ref 6–20)
CO2: 22 mmol/L (ref 22–32)
Calcium: 9.4 mg/dL (ref 8.9–10.3)
Chloride: 103 mmol/L (ref 98–111)
Creatinine, Ser: 0.81 mg/dL (ref 0.44–1.00)
GFR, Estimated: >60 mL/min
Glucose, Bld: 82 mg/dL (ref 70–99)
Potassium: 4.3 mmol/L (ref 3.5–5.1)
Sodium: 138 mmol/L (ref 135–145)

## 2024-12-03 LAB — RESP PANEL BY RT-PCR (RSV, FLU A&B, COVID)  RVPGX2
Influenza A by PCR: NEGATIVE
Influenza B by PCR: NEGATIVE
Resp Syncytial Virus by PCR: NEGATIVE
SARS Coronavirus 2 by RT PCR: NEGATIVE

## 2024-12-03 NOTE — ED Triage Notes (Signed)
 Cough x 2 months , children with flu and strep . Experienced shortness of breath and chest pain x 1 week . Hx asthma

## 2024-12-03 NOTE — ED Notes (Addendum)
 Currently out of SABA/HFA

## 2024-12-03 NOTE — ED Provider Notes (Signed)
 " [place] [facility] Provider Note   CSN: [id number] Arrival date & time: 12/03/24  1120     Patient presents with: Shortness of Breath   Nicole Barron is a 39 y.o. female.   39 year old female presents today for concern of shortness of breath, and chest discomfort.  Productive cough, chest discomfort.  She states she noticed the chest discomfort last night and this morning.  Otherwise she has been intermittently sick now for 2 months.  Has history of asthma.  She states she is prone to getting pneumonia so she wanted to come in for evaluation.  No fever.  The history is provided by the patient. No language interpreter was used.       Prior to Admission medications  Medication Sig Start Date End Date Taking? Authorizing Provider  albuterol (PROVENTIL HFA;VENTOLIN HFA) 108 (90 Base) MCG/ACT inhaler Inhale into the lungs every 6 (six) hours as needed for wheezing or shortness of breath.    [provider]  doxylamine, Sleep, (UNISOM) 25 MG tablet Take by mouth. 03/10/19 04/09/19  [provider]  metroNIDAZOLE  (FLAGYL ) 500 MG tablet Take 1 tablet (500 mg total) by mouth 2 (two) times daily. 03/11/24   Curatolo, Adam, DO  Prenatal Vit-Fe Fumarate-FA (PRENATAL COMPLETE) 14-0.4 MG TABS Take 1 tablet by mouth daily. 02/24/19   Dreama Longs, MD  progesterone (PROMETRIUM) 200 MG capsule Place vaginally. 02/26/19   [provider]    Allergies: Butalbital-apap-caffeine and Latex    Review of Systems  Constitutional:  Negative for chills and fever.  Respiratory:  Positive for cough. Negative for shortness of breath.   Cardiovascular:  Positive for chest pain.  Neurological:  Negative for light-headedness.  All other systems reviewed and are negative.   Updated Vital Signs BP 136/88   Pulse 80   Temp 99 F (37.2 C)   Resp 18   Wt 79.4 kg   SpO2 100%   BMI 30.04 kg/m   Physical Exam Vitals and nursing note reviewed.   Constitutional:      General: She is not in acute distress.    Appearance: Normal appearance. She is not ill-appearing.  HENT:     Head: Normocephalic and atraumatic.     Nose: Nose normal.  Eyes:     Conjunctiva/sclera: Conjunctivae normal.  Cardiovascular:     Rate and Rhythm: Normal rate and regular rhythm.  Pulmonary:     Effort: Pulmonary effort is normal. No respiratory distress.     Breath sounds: Normal breath sounds. No wheezing.  Musculoskeletal:        General: No deformity.  Skin:    Findings: No rash.  Neurological:     Mental Status: She is alert.     (all labs ordered are listed, but only abnormal results are displayed) Labs Reviewed  RESP PANEL BY RT-PCR (RSV, FLU A&B, COVID)  RVPGX2    EKG: None  Radiology: No results found.   Procedures   Medications Ordered in the ED - No data to display                                  Medical Decision Making Amount and/or Complexity of Data Reviewed Labs: ordered. Radiology: ordered.   Medical Decision Making / ED Course   This patient presents to the ED for concern of cough, shortness of breath, chest, this involves an extensive number of  treatment options, and is a complaint that carries with it a high risk of complications and morbidity.  The differential diagnosis includes ACS, PE, sinusitis, viral URI  MDM: 39 year old female presents today for concern of cough, shortness of breath, chest pain.  Wants to make sure she does not have pneumonia.  Also has had chest pain for the past couple days.  No other anginal symptoms.  No prior history of CAD.  Low heart score.  Denies any pleuritic chest pain, and she is without tachycardia, tachypnea, or hypoxia.  Low suspicion for PE.  CBC without leukocytosis or anemia.  BMP unremarkable.  Troponin negative.  Respiratory panel negative.  Chest x-ray without acute cardiopulmonary process.  EKG without acute ischemic change.  Patient discharged in stable  condition. Discussed sinus rinse.   Lab Tests: -I ordered, reviewed, and interpreted labs.   The pertinent results include:   Labs Reviewed  CBC WITH DIFFERENTIAL/PLATELET - Abnormal; Notable for the following components:      Result Value   MCH 25.7 (*)    All other components within normal limits  RESP PANEL BY RT-PCR (RSV, FLU A&B, COVID)  RVPGX2  BASIC METABOLIC PANEL WITH GFR  TROPONIN T, HIGH SENSITIVITY      EKG  EKG Interpretation Date/Time:  Friday December 03 2024 12:30:24 EST Ventricular Rate:  72 PR Interval:  148 QRS Duration:  74 QT Interval:  379 QTC Calculation: 415 R Axis:   81  Text Interpretation: Sinus rhythm Left atrial enlargement Confirmed by Ruthe Cornet [phone]) on 12/03/2024 12:32:16 PM         Imaging Studies ordered: I ordered imaging studies including chest x-ray I independently visualized and interpreted imaging. I agree with the radiologist interpretation   Medicines ordered and prescription drug management: No orders of the defined types were placed in this encounter.   -I have reviewed the patients home medicines and have made adjustments as needed  Cardiac Monitoring: The patient was maintained on a cardiac monitor.  I personally viewed and interpreted the cardiac monitored which showed an underlying rhythm of: NSR   Reevaluation: After the interventions noted above, I reevaluated the patient and found that they have :stayed the same  Co morbidities that complicate the patient evaluation  Past Medical History:  Diagnosis Date   Asthma    Fibromyalgia    Hyperemesis gravidarum    Hypotension    UTI (urinary tract infection)       Dispostion: Discharged in stable condition.  Return precaution discussed.  Patient voices understanding and is in agreement with plan.   Final diagnoses:  Atypical chest pain  Viral URI with cough  Sinus congestion    ED Discharge Orders     None          Hildegard Loge,  PA-C 12/03/24 1405    Ruthe Cornet, DO 12/03/24 1411  "

## 2024-12-03 NOTE — Discharge Instructions (Addendum)
 Your workup today was reassuring.  No concerning findings regarding your symptoms.  No evidence of pneumonia, heart attack.  Follow-up with your primary care doctor.
